# Patient Record
Sex: Female | Born: 1960 | Race: White | Hispanic: No | Marital: Married | Smoking: Never smoker
Health system: Southern US, Community
[De-identification: ages and names within clinical notes are randomized; demographics above are authoritative.]

## PROBLEM LIST (undated history)

## (undated) DIAGNOSIS — M069 Rheumatoid arthritis, unspecified: Secondary | ICD-10-CM

## (undated) HISTORY — DX: Rheumatoid arthritis, unspecified: M06.9

---

## 1991-06-14 HISTORY — PX: NASAL SINUS SURGERY: SHX719

## 1997-07-18 ENCOUNTER — Inpatient Hospital Stay (HOSPITAL_COMMUNITY): Admission: AD | Admit: 1997-07-18 | Discharge: 1997-07-21 | Payer: Self-pay | Admitting: Obstetrics and Gynecology

## 1997-07-23 ENCOUNTER — Encounter: Admission: RE | Admit: 1997-07-23 | Discharge: 1997-10-21 | Payer: Self-pay | Admitting: Obstetrics and Gynecology

## 1998-06-13 HISTORY — PX: DILATION AND CURETTAGE OF UTERUS: SHX78

## 1998-09-28 ENCOUNTER — Other Ambulatory Visit: Admission: RE | Admit: 1998-09-28 | Discharge: 1998-09-28 | Payer: Self-pay | Admitting: Obstetrics and Gynecology

## 1999-08-04 ENCOUNTER — Encounter: Admission: RE | Admit: 1999-08-04 | Discharge: 1999-08-04 | Payer: Self-pay | Admitting: Family Medicine

## 1999-08-04 ENCOUNTER — Encounter: Payer: Self-pay | Admitting: Family Medicine

## 1999-09-28 ENCOUNTER — Ambulatory Visit (HOSPITAL_COMMUNITY): Admission: RE | Admit: 1999-09-28 | Discharge: 1999-09-28 | Payer: Self-pay | Admitting: Obstetrics and Gynecology

## 1999-09-28 ENCOUNTER — Encounter: Payer: Self-pay | Admitting: Obstetrics and Gynecology

## 1999-10-07 ENCOUNTER — Ambulatory Visit (HOSPITAL_COMMUNITY): Admission: RE | Admit: 1999-10-07 | Discharge: 1999-10-07 | Payer: Self-pay | Admitting: Obstetrics and Gynecology

## 1999-10-07 ENCOUNTER — Encounter (INDEPENDENT_AMBULATORY_CARE_PROVIDER_SITE_OTHER): Payer: Self-pay | Admitting: Specialist

## 1999-10-22 ENCOUNTER — Other Ambulatory Visit: Admission: RE | Admit: 1999-10-22 | Discharge: 1999-10-22 | Payer: Self-pay | Admitting: Obstetrics and Gynecology

## 2000-08-16 ENCOUNTER — Ambulatory Visit (HOSPITAL_COMMUNITY): Admission: RE | Admit: 2000-08-16 | Discharge: 2000-08-16 | Payer: Self-pay | Admitting: Obstetrics and Gynecology

## 2000-08-16 ENCOUNTER — Encounter: Payer: Self-pay | Admitting: Obstetrics and Gynecology

## 2001-01-12 ENCOUNTER — Inpatient Hospital Stay (HOSPITAL_COMMUNITY): Admission: AD | Admit: 2001-01-12 | Discharge: 2001-01-14 | Payer: Self-pay | Admitting: Obstetrics and Gynecology

## 2001-02-23 ENCOUNTER — Other Ambulatory Visit: Admission: RE | Admit: 2001-02-23 | Discharge: 2001-02-23 | Payer: Self-pay | Admitting: Obstetrics and Gynecology

## 2002-02-25 ENCOUNTER — Other Ambulatory Visit: Admission: RE | Admit: 2002-02-25 | Discharge: 2002-02-25 | Payer: Self-pay | Admitting: Obstetrics and Gynecology

## 2003-02-26 ENCOUNTER — Other Ambulatory Visit: Admission: RE | Admit: 2003-02-26 | Discharge: 2003-02-26 | Payer: Self-pay | Admitting: Obstetrics and Gynecology

## 2004-03-12 ENCOUNTER — Encounter: Admission: RE | Admit: 2004-03-12 | Discharge: 2004-03-12 | Payer: Self-pay | Admitting: Obstetrics and Gynecology

## 2004-07-16 ENCOUNTER — Encounter: Admission: RE | Admit: 2004-07-16 | Discharge: 2004-07-16 | Payer: Self-pay | Admitting: Obstetrics and Gynecology

## 2005-03-07 ENCOUNTER — Other Ambulatory Visit: Admission: RE | Admit: 2005-03-07 | Discharge: 2005-03-07 | Payer: Self-pay | Admitting: Obstetrics and Gynecology

## 2005-06-03 ENCOUNTER — Ambulatory Visit: Payer: Self-pay | Admitting: Internal Medicine

## 2005-06-23 ENCOUNTER — Ambulatory Visit: Payer: Self-pay | Admitting: Internal Medicine

## 2005-07-05 ENCOUNTER — Ambulatory Visit: Payer: Self-pay | Admitting: Oncology

## 2005-07-22 ENCOUNTER — Ambulatory Visit: Payer: Self-pay | Admitting: Internal Medicine

## 2005-07-28 ENCOUNTER — Encounter: Admission: RE | Admit: 2005-07-28 | Discharge: 2005-07-28 | Payer: Self-pay | Admitting: Obstetrics and Gynecology

## 2006-03-08 ENCOUNTER — Other Ambulatory Visit: Admission: RE | Admit: 2006-03-08 | Discharge: 2006-03-08 | Payer: Self-pay | Admitting: Obstetrics and Gynecology

## 2006-08-02 ENCOUNTER — Encounter: Admission: RE | Admit: 2006-08-02 | Discharge: 2006-08-02 | Payer: Self-pay | Admitting: Obstetrics and Gynecology

## 2007-08-06 ENCOUNTER — Encounter: Admission: RE | Admit: 2007-08-06 | Discharge: 2007-08-06 | Payer: Self-pay | Admitting: Obstetrics and Gynecology

## 2008-08-07 ENCOUNTER — Encounter: Admission: RE | Admit: 2008-08-07 | Discharge: 2008-08-07 | Payer: Self-pay | Admitting: Obstetrics and Gynecology

## 2009-08-14 ENCOUNTER — Encounter: Admission: RE | Admit: 2009-08-14 | Discharge: 2009-08-14 | Payer: Self-pay | Admitting: Obstetrics and Gynecology

## 2010-09-01 ENCOUNTER — Other Ambulatory Visit: Payer: Self-pay | Admitting: Obstetrics and Gynecology

## 2010-09-01 DIAGNOSIS — Z1231 Encounter for screening mammogram for malignant neoplasm of breast: Secondary | ICD-10-CM

## 2010-09-21 ENCOUNTER — Ambulatory Visit: Payer: Self-pay

## 2010-09-24 ENCOUNTER — Ambulatory Visit
Admission: RE | Admit: 2010-09-24 | Discharge: 2010-09-24 | Disposition: A | Discharge status: Home / Self Care | Payer: BC Managed Care – PPO | Source: Ambulatory Visit | Attending: Obstetrics and Gynecology | Admitting: Obstetrics and Gynecology

## 2010-09-24 DIAGNOSIS — Z1231 Encounter for screening mammogram for malignant neoplasm of breast: Secondary | ICD-10-CM

## 2010-10-29 NOTE — Discharge Summary (Signed)
[facility]  Patient:    Carmen Christensen, Carmen Christensen                      MRN: [id number] Adm. Date:  [id number] Disc. Date: [id number] Attending:  Malon Kindle                           Discharge Summary  ADMISSION DIAGNOSES:          1. Intrauterine pregnancy at 39 weeks.                               2. Group B streptococcus carrier.                               3. Advanced maternal age.  DISCHARGE DIAGNOSES:          1. Intrauterine pregnancy at 39 weeks.                               2. Group B streptococcus carrier.                               3. Advanced maternal age.  PROCEDURES:                   Spontaneous vaginal delivery.  COMPLICATIONS:                None.  CONSULTATIONS:                None.  HISTORY AND PHYSICAL:         This is a 50 year old white female, gravida 3, para 1-0-1-1 with an EGA of [redacted] weeks by an LMP consistent with a 9-week ultrasound with a due date of August 6, who presented with a complaint of ruptured membranes at approximately 2330 on August 1 followed by irregular contractions. Prenatal care was complicated by anemia treated with iron.  PRENATAL LABORATORY DATA:     Blood type A positive with a negative antibody screen. RPR nonreactive, rubella immune, hepatitis B surface antigen negative, HIV negative, gonorrhea and Chlamydia negative, triple screen was declined. Amniocentesis was declined. One-hour glucola was 88. Group B strep was positive with her last pregnancy.  PAST OBSTETRICAL HISTORY:     February 1999 vaginal delivery at term, 6 pounds 15 ounces, at 39 weeks, and she had a protracted course. In April 2001 she had a missed abortion treated with a D&C.  GYNECOLOGICAL HISTORY:        History of human papillomavirus and she had cryotherapy in 1990 with normal followup Pap smears.  PAST MEDICAL HISTORY:         History of depression, asthma, and eczema.  PAST SURGICAL HISTORY:        In 1994 she had  sinus surgery and in 2001 the D&C and cryotherapy.  ALLERGIES:                    None known.  CURRENT MEDICATIONS:          Iron and prenatal vitamins.  PHYSICAL EXAMINATION:         She was afebrile with stable vital signs. Abdomen soft with a fundal height  of 38 cm. Fetal heart tracing had decelerations following placement of her epidural catheter. Vaginal exam per the nurses on admission, she was 1/90/-1 with evidence of ruptured membranes.  HOSPITAL COURSE:              The patient was admitted with ruptured membranes and with irregular contractions. She was started on penicillin for group B strep and eventually received an epidural. Dr. Ambrose Mantle examined her on the morning of August 2 and she was 4 cm dilated, 80% effaced, with molding. She continued to progress on her own, and on my first exam later that morning, she was 9, complete, and +1. She progressed to complete and pushed well with a slightly prolonged second stage. She had an SVD of a viable female infant with Apgars of 7 and 9 that weighed 7 pounds 5 ounces over a second-degree laceration. The placenta delivered spontaneous and was intact. Her laceration was repaired with 3-0 Vicryl and she had marked labial edema. Estimated blood loss was less than 500 cc.  Postpartum she did very well, remained afebrile, and breast fed her baby without complications. On the morning of postpartum day #2 she was stable for discharge home.  CONDITION ON DISCHARGE:       Stable.  DISPOSITION:                  Discharge to home.  DISCHARGE INSTRUCTIONS:       Diet: Regular. Activity: Pelvic rest.  DISCHARGE FOLLOWUP:           The patient is to follow up in four to six weeks and she is given our discharge pamphlet. DD:  01/14/01 TD:  01/15/01 Job: [id number]

## 2011-09-12 ENCOUNTER — Other Ambulatory Visit: Payer: Self-pay | Admitting: Obstetrics and Gynecology

## 2011-09-12 DIAGNOSIS — Z1231 Encounter for screening mammogram for malignant neoplasm of breast: Secondary | ICD-10-CM

## 2011-09-29 ENCOUNTER — Ambulatory Visit
Admission: RE | Admit: 2011-09-29 | Discharge: 2011-09-29 | Disposition: A | Discharge status: Home / Self Care | Payer: BC Managed Care – PPO | Source: Ambulatory Visit | Attending: Obstetrics and Gynecology | Admitting: Obstetrics and Gynecology

## 2011-09-29 DIAGNOSIS — Z1231 Encounter for screening mammogram for malignant neoplasm of breast: Secondary | ICD-10-CM

## 2012-08-13 ENCOUNTER — Other Ambulatory Visit: Payer: Self-pay

## 2012-10-01 ENCOUNTER — Ambulatory Visit
Admission: RE | Admit: 2012-10-01 | Discharge: 2012-10-01 | Disposition: A | Discharge status: Home / Self Care | Payer: BC Managed Care – PPO | Source: Ambulatory Visit

## 2012-10-01 DIAGNOSIS — Z1231 Encounter for screening mammogram for malignant neoplasm of breast: Secondary | ICD-10-CM

## 2012-12-10 ENCOUNTER — Telehealth: Payer: Self-pay | Admitting: Internal Medicine

## 2012-12-10 NOTE — Telephone Encounter (Signed)
Patient had a colonoscopy in 2007 she reports no polyps.  I have ordered her paper chart.  She has family history of colon polyps in her mother and father and colon cancer in a paternal grandfather.  She is wondering if she is due for a colonoscopy at age 52?  I will put the chart in your office when it is available.

## 2012-12-12 NOTE — Telephone Encounter (Signed)
She could have a colonoscopy any time before January 2017, would be reasonable given her history. Not as urgent, given the fact that she had a negative exam in 2007.

## 2012-12-13 NOTE — Telephone Encounter (Signed)
Spoke with pt and she is aware. Recall entered. 

## 2014-01-31 IMAGING — MG MM DIGITAL SCREENING BILAT
8 series · 9 of 24 positions shown · non-contrast
Comparison: Previous exams.

CLINICAL DATA: Screening.

DIGITAL BILATERAL SCREENING MAMMOGRAM WITH CAD
DIGITAL BREAST TOMOSYNTHESIS
Digital breast tomosynthesis images are acquired in two
projections.  These images are reviewed in combination with the
digital mammogram, confirming the findings below.

[R CC]
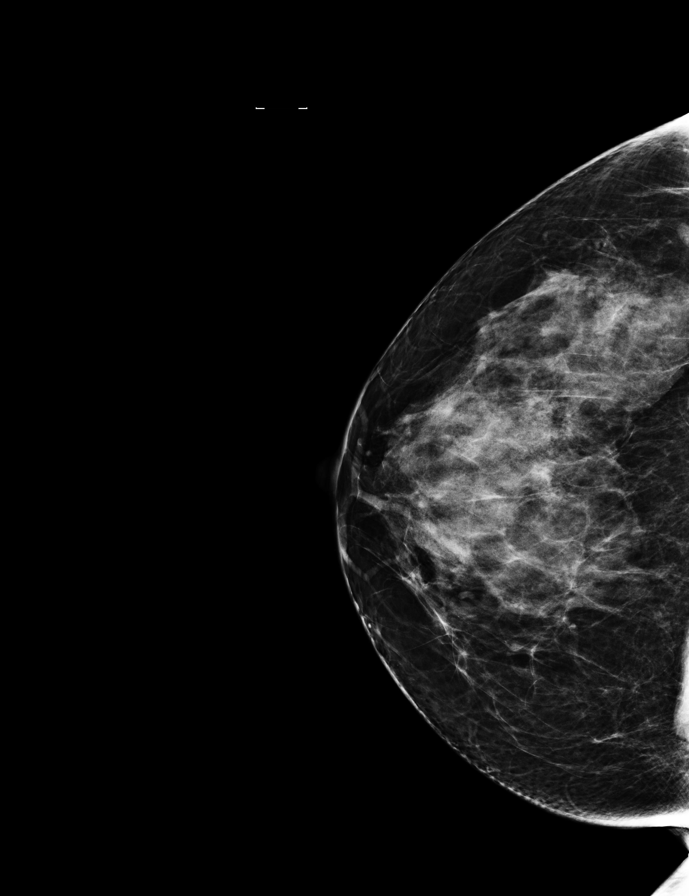

[R MLO]
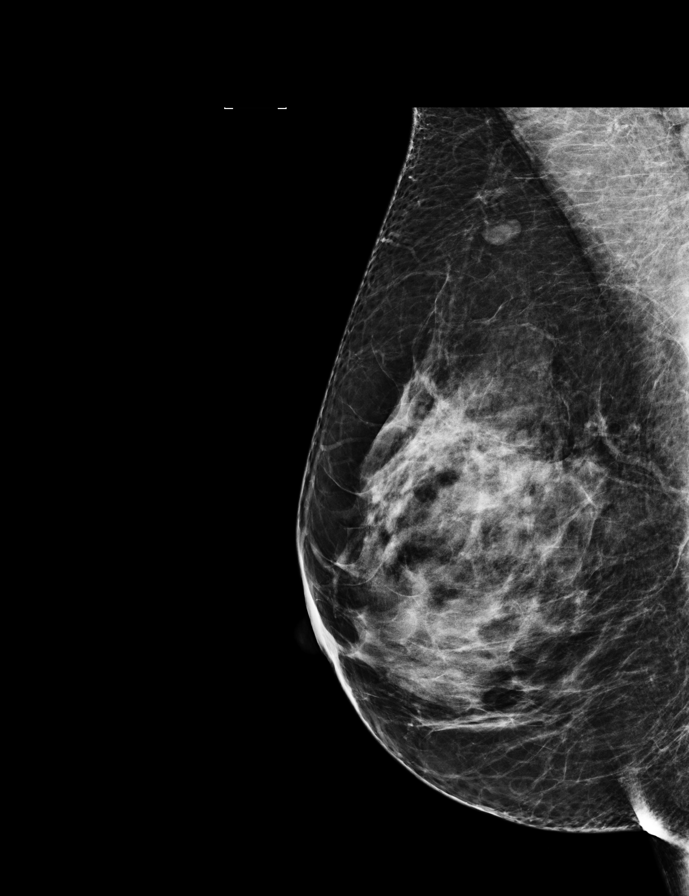

[L CC]
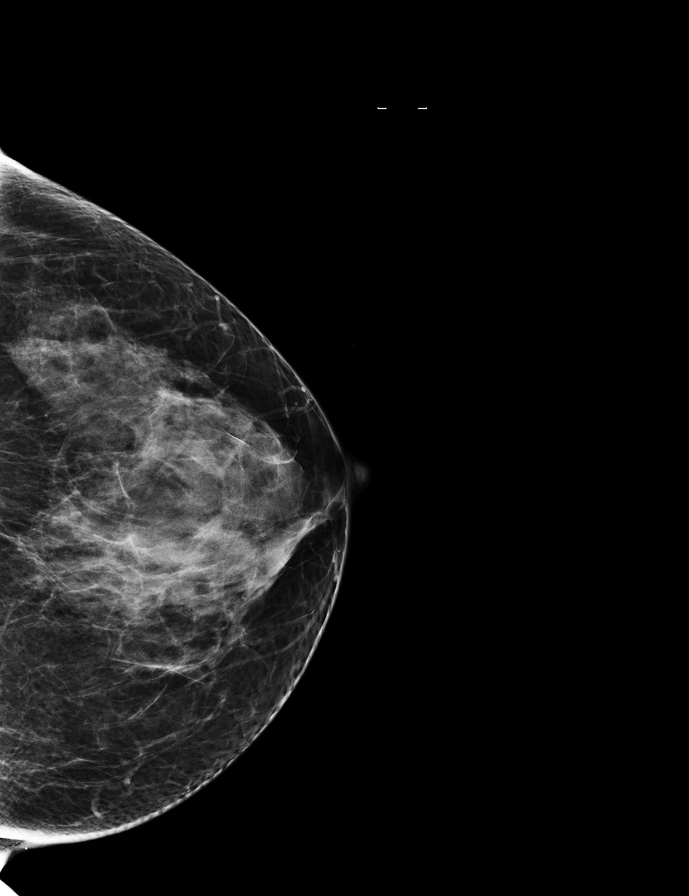

[L MLO]
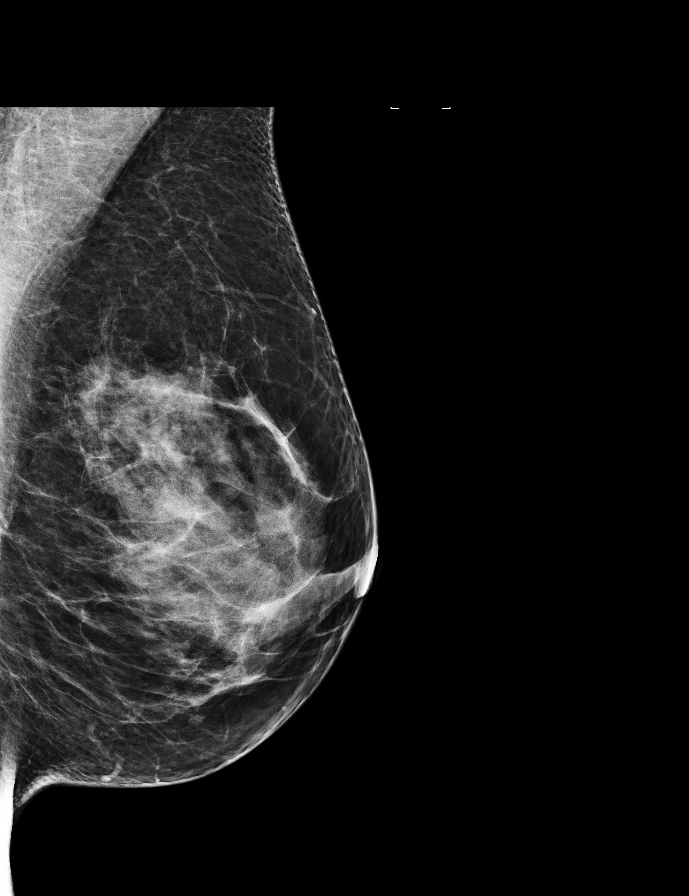

[L CC tomo · 2 of 72 frames shown]
[frame 24/72]
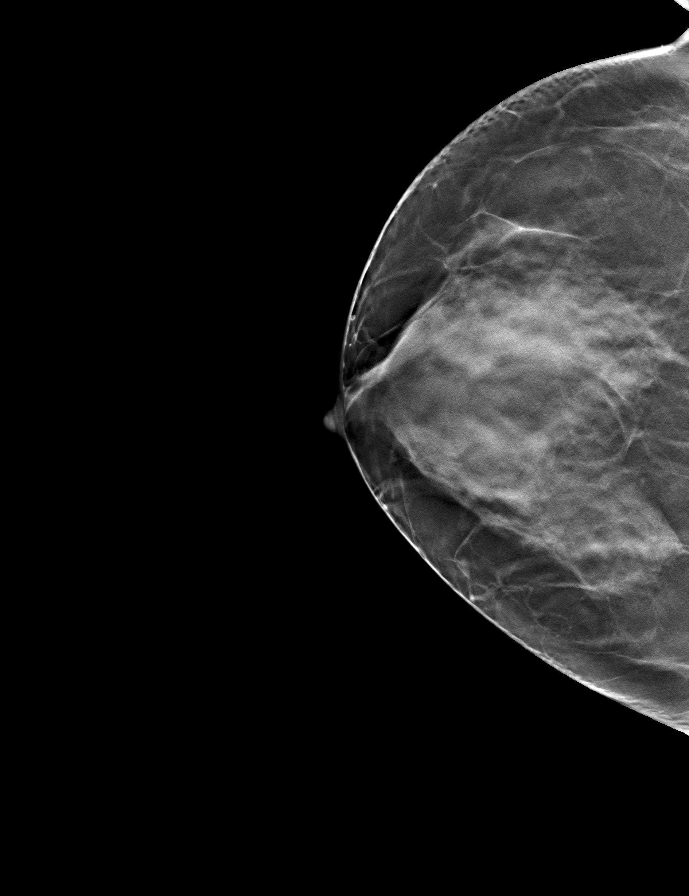
[frame 37/72]
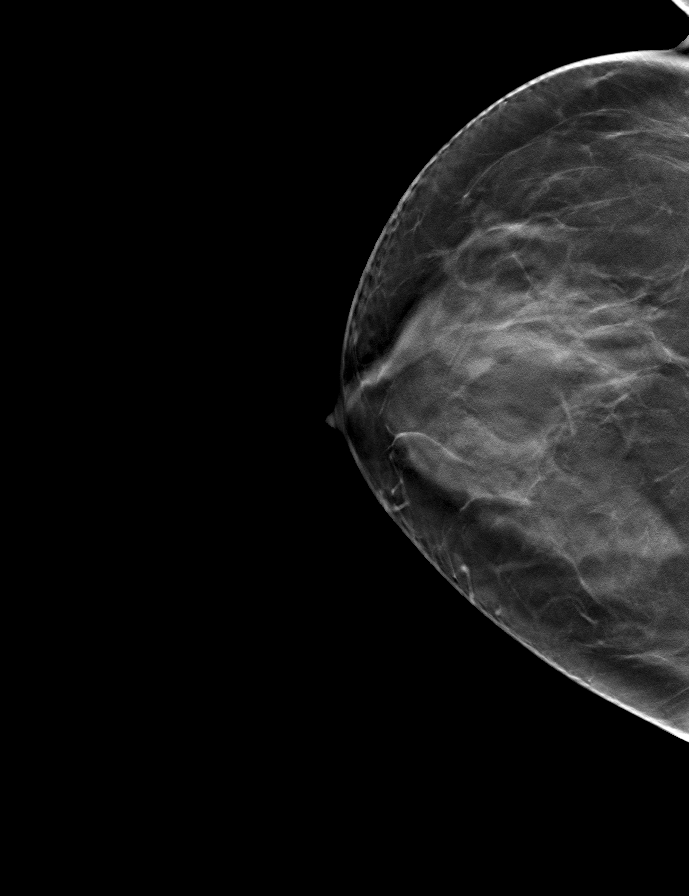

[R MLO tomo · tomo slice 37/72.0]
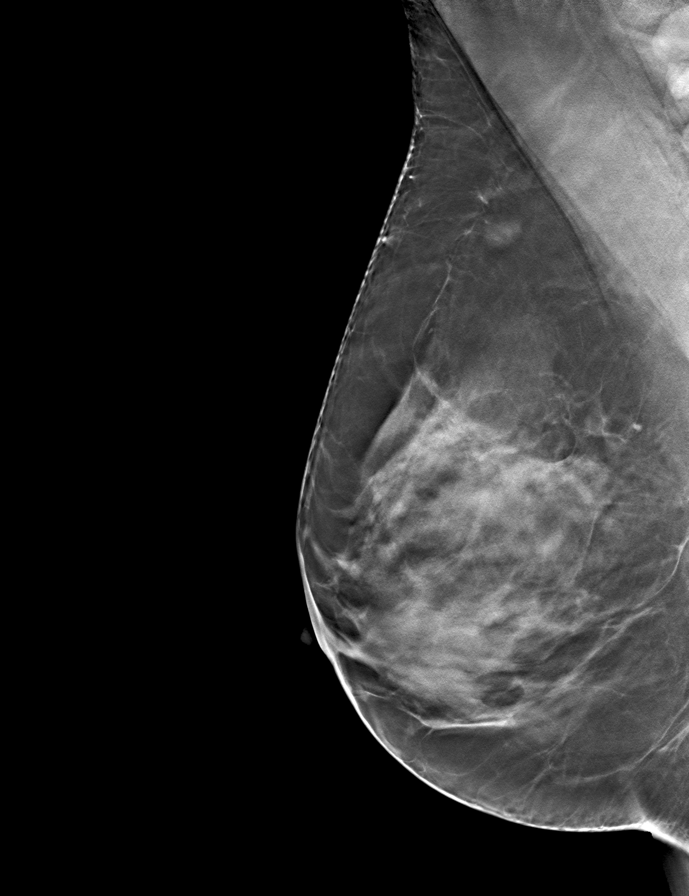

[R CC tomo · tomo slice 37/73.0]
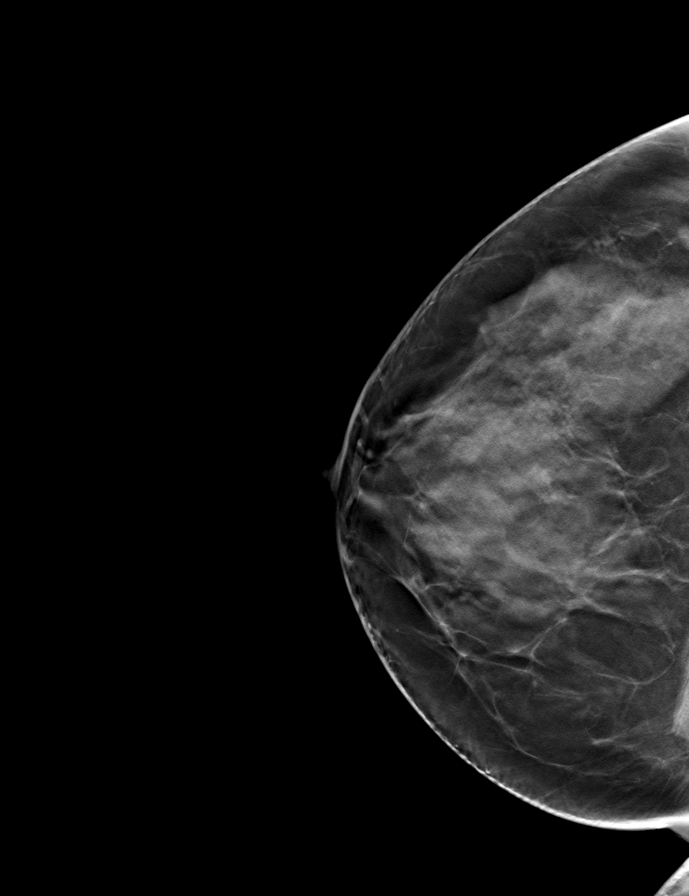

[L MLO tomo · tomo slice 35/68.0]
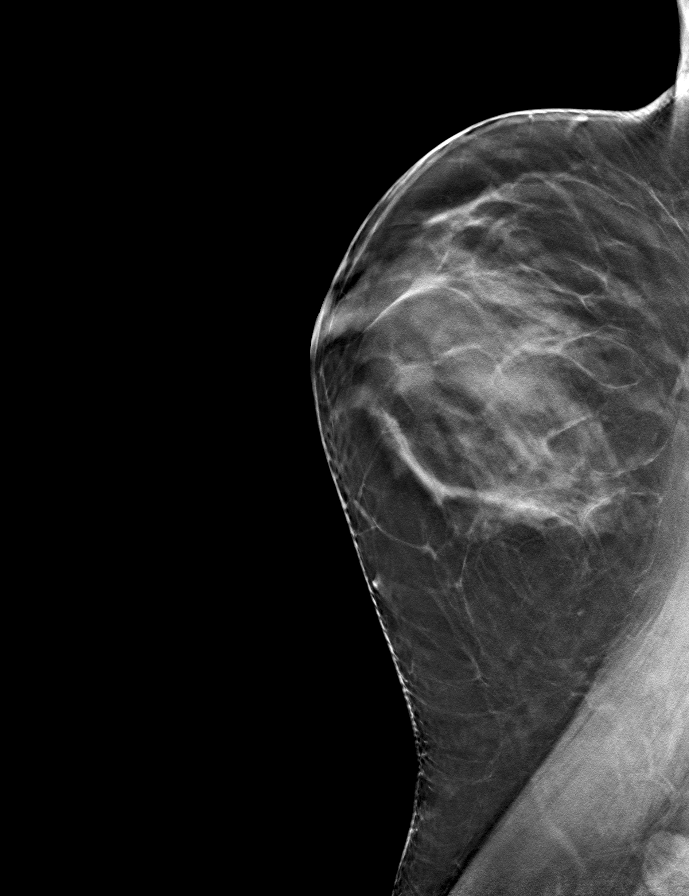

[9 of 24 positions shown; findings below may reference images not displayed]

FINDINGS: ACR Breast Density Category 4: The breast tissue is extremely
dense.

No suspicious masses, architectural distortion, or calcifications
are present.

Images were processed with CAD.
IMPRESSION: No mammographic evidence of malignancy.

A result letter of this screening mammogram will be mailed directly
to the patient.

RECOMMENDATION:
Screening mammogram in one year. (Code:J6-M-LX9)

BI-RADS CATEGORY 1:  Negative.

## 2015-05-12 ENCOUNTER — Encounter: Payer: Self-pay | Admitting: Internal Medicine

## 2015-07-16 ENCOUNTER — Ambulatory Visit (AMBULATORY_SURGERY_CENTER): Payer: Self-pay | Admitting: *Deleted

## 2015-07-16 VITALS — Ht 61.0 in | Wt 129.6 lb

## 2015-07-16 DIAGNOSIS — Z1211 Encounter for screening for malignant neoplasm of colon: Secondary | ICD-10-CM

## 2015-07-16 MED ORDER — NA SULFATE-K SULFATE-MG SULF 17.5-3.13-1.6 GM/177ML PO SOLN: ORAL | Status: DC

## 2015-07-16 NOTE — Progress Notes (Signed)
No allergies to eggs or soy. No problems with anesthesia.  Pt given Emmi instructions for colonoscopy  No oxygen use  No diet drug use  

## 2015-07-30 ENCOUNTER — Ambulatory Visit (AMBULATORY_SURGERY_CENTER): Payer: BC Managed Care – PPO | Admitting: Internal Medicine

## 2015-07-30 ENCOUNTER — Encounter: Payer: Self-pay | Admitting: Internal Medicine

## 2015-07-30 VITALS — BP 105/57 | HR 85 | Temp 98.0°F | Resp 20 | Ht 61.0 in | Wt 129.0 lb

## 2015-07-30 DIAGNOSIS — Z1211 Encounter for screening for malignant neoplasm of colon: Secondary | ICD-10-CM | POA: Diagnosis present

## 2015-07-30 MED ORDER — SODIUM CHLORIDE 0.9 % IV SOLN: 500.0000 mL | INTRAVENOUS | Status: DC

## 2015-07-30 NOTE — Patient Instructions (Addendum)
YOU HAD AN ENDOSCOPIC PROCEDURE TODAY AT THE [facility]:   Refer to the procedure report that was given to you for any specific questions about what was found during the examination.  If the procedure report does not answer your questions, please call your gastroenterologist to clarify.  If you requested that your care partner not be given the details of your procedure findings, then the procedure report has been included in a sealed envelope for you to review at your convenience later.  YOU SHOULD EXPECT: Some feelings of bloating in the abdomen. Passage of more gas than usual.  Walking can help get rid of the air that was put into your GI tract during the procedure and reduce the bloating. If you had a lower endoscopy (such as a colonoscopy or flexible sigmoidoscopy) you may notice spotting of blood in your stool or on the toilet paper. If you underwent a bowel prep for your procedure, you may not have a normal bowel movement for a few days.  Please Note:  You might notice some irritation and congestion in your nose or some drainage.  This is from the oxygen used during your procedure.  There is no need for concern and it should clear up in a day or so.  SYMPTOMS TO REPORT IMMEDIATELY:   Following lower endoscopy (colonoscopy or flexible sigmoidoscopy):  Excessive amounts of blood in the stool  Significant tenderness or worsening of abdominal pains  Swelling of the abdomen that is new, acute  Fever of 100F or higher  For urgent or emergent issues, a gastroenterologist can be reached at any hour by calling [phone].   DIET: Your first meal following the procedure should be a small meal and then it is ok to progress to your normal diet. Heavy or fried foods are harder to digest and may make you feel nauseous or bloated.  Likewise, meals heavy in dairy and vegetables can increase bloating.  Drink plenty of fluids but you should avoid alcoholic beverages for 24  hours.  ACTIVITY:  You should plan to take it easy for the rest of today and you should NOT DRIVE or use heavy machinery until tomorrow (because of the sedation medicines used during the test).    FOLLOW UP: Our staff will call the number listed on your records the next business day following your procedure to check on you and address any questions or concerns that you may have regarding the information given to you following your procedure. If we do not reach you, we will leave a message.  However, if you are feeling well and you are not experiencing any problems, there is no need to return our call.  We will assume that you have returned to your regular daily activities without incident.  If any biopsies were taken you will be contacted by phone or by letter within the next 1-3 weeks.  Please call us at [phone] if you have not heard about the biopsies in 3 weeks.    SIGNATURES/CONFIDENTIALITY: You and/or your care partner have signed paperwork which will be entered into your electronic medical record.  These signatures attest to the fact that that the information above on your After Visit Summary has been reviewed and is understood.  Full responsibility of the confidentiality of this discharge information lies with you and/or your care-partner.  Next colonoscopy in 10 years. 

## 2015-07-30 NOTE — Op Note (Signed)
[facility] [address] [place], [address]   COLONOSCOPY PROCEDURE REPORT  PATIENT: Carmen Christensen, Carmen Christensen  MR#: [id number] BIRTHDATE: 02/19/1961 , 54  yrs. old GENDER: female ENDOSCOPIST: Roxy Cedar, MD REFERRED XB:[id number] Recall, PROCEDURE DATE:  07/30/2015 PROCEDURE:   Colonoscopy, screening First Screening Colonoscopy - Avg.  risk and is 50 yrs.  old or older - No.  Prior Negative Screening - Now for repeat screening. N/A  History of Adenoma - Now for follow-up colonoscopy & has been > or = to 3 yrs.  N/A  Polyps removed today? No Recommend repeat exam, <10 yrs? No ASA CLASS:   Class I INDICATIONS:Screening for colonic neoplasia and Colorectal Neoplasm Risk Assessment for this procedure is average risk.   . Her examination 2007 to evaluate heme positive stool. This was normal MEDICATIONS: Monitored anesthesia care and Propofol 300 mg IV  DESCRIPTION OF PROCEDURE:   After the risks benefits and alternatives of the procedure were thoroughly explained, informed consent was obtained.  The digital rectal exam revealed no abnormalities of the rectum.   The LB [id number]  endoscope was introduced through the anus and advanced to the cecum, which was identified by both the appendix and ileocecal valve. No adverse events experienced.   The quality of the prep was excellent. (Suprep was used)  The instrument was then slowly withdrawn as the colon was fully examined. Estimated blood loss is zero unless otherwise noted in this procedure report.    COLON FINDINGS: A normal appearing cecum, ileocecal valve, and appendiceal orifice were identified.  The ascending, transverse, descending, sigmoid colon, and rectum appeared unremarkable. Retroflexed views revealed internal hemorrhoids. The time to cecum = 1.8 Withdrawal time = 6.6   The scope was withdrawn and the procedure completed. COMPLICATIONS: There were no immediate complications.  ENDOSCOPIC  IMPRESSION: 1. Normal colonoscopy  RECOMMENDATIONS: 1. Continue current colorectal screening recommendations for "routine risk" patients with a repeat colonoscopy in 10 years.  eSigned:  Roxy Cedar, MD 07/30/2015 1:03 PM   cc: The Patient, Tracey Harries, MD, and Huel Cote, MD

## 2015-07-30 NOTE — Progress Notes (Signed)
Report to PACU, RN, vss, BBS= Clear.  

## 2015-07-31 ENCOUNTER — Telehealth: Payer: Self-pay

## 2015-07-31 NOTE — Telephone Encounter (Signed)
  Follow up Call-  Call back number 07/30/2015  Post procedure Call Back phone  # [phone]  Permission to leave phone message Yes     Patient questions:  Do you have a fever, pain , or abdominal swelling? No. Pain Score  0 *  Have you tolerated food without any problems? Yes.    Have you been able to return to your normal activities? Yes.    Do you have any questions about your discharge instructions: Diet   No. Medications  No. Follow up visit  No.  Do you have questions or concerns about your Care? No.  Actions: * If pain score is 4 or above: No action needed, pain <4.

## 2019-05-02 ENCOUNTER — Ambulatory Visit: Payer: Self-pay

## 2019-05-02 ENCOUNTER — Encounter: Payer: Self-pay | Admitting: Rheumatology

## 2019-05-02 ENCOUNTER — Other Ambulatory Visit: Payer: Self-pay

## 2019-05-02 ENCOUNTER — Ambulatory Visit: Payer: BC Managed Care – PPO | Admitting: Rheumatology

## 2019-05-02 VITALS — BP 121/65 | HR 99 | Resp 14 | Ht 61.0 in | Wt 126.6 lb

## 2019-05-02 DIAGNOSIS — M79642 Pain in left hand: Secondary | ICD-10-CM | POA: Diagnosis not present

## 2019-05-02 DIAGNOSIS — M79641 Pain in right hand: Secondary | ICD-10-CM

## 2019-05-02 DIAGNOSIS — M79672 Pain in left foot: Secondary | ICD-10-CM | POA: Diagnosis not present

## 2019-05-02 DIAGNOSIS — G8929 Other chronic pain: Secondary | ICD-10-CM

## 2019-05-02 DIAGNOSIS — M0579 Rheumatoid arthritis with rheumatoid factor of multiple sites without organ or systems involvement: Secondary | ICD-10-CM

## 2019-05-02 DIAGNOSIS — M79671 Pain in right foot: Secondary | ICD-10-CM | POA: Diagnosis not present

## 2019-05-02 DIAGNOSIS — M25511 Pain in right shoulder: Secondary | ICD-10-CM

## 2019-05-02 DIAGNOSIS — F5101 Primary insomnia: Secondary | ICD-10-CM

## 2019-05-02 DIAGNOSIS — Z79899 Other long term (current) drug therapy: Secondary | ICD-10-CM

## 2019-05-02 DIAGNOSIS — M25512 Pain in left shoulder: Secondary | ICD-10-CM

## 2019-05-02 MED ORDER — PREDNISONE 5 MG PO TABS: ORAL_TABLET | ORAL | 0 refills | Status: AC

## 2019-05-02 NOTE — Progress Notes (Signed)
Pharmacy Note  Subjective: Patient presents today to [facility] for follow up office visit.   Patient seen by the pharmacist for counseling on hydroxychloroquine rheumatoid arthritis. She is naive to therapy.  Objective: CMP: pending 05/02/2019  CBC: pending 05/02/2019  Assessment/Plan: Patient was counseled on the purpose, proper use, and adverse effects of hydroxychloroquine including nausea/diarrhea, skin rash, headaches, and sun sensitivity.  Discussed importance of annual eye exams while on hydroxychloroquine to monitor to ocular toxicity and discussed importance of frequent laboratory monitoring.  Provided patient with eye exam form for baseline ophthalmologic exam and standing lab instructions.  Provided patient with educational materials on hydroxychloroquine and answered all questions.  Patient consented to hydroxychloroquine.  Will upload consent in the media tab.    Dose will be Plaquenil 200 mg twice daily Monday through Friday based on weight of 57.4 kg and 5'1". Prescription pending lab results.  She is also to start a prednisone taper 20 mg taper by 5 every 4 days.  All questions encouraged and answered.  Instructed patient to call with any other questions or concerns.  Mariella Saa, PharmD, [place], [place] [facility] Pharmacist [phone]  05/02/2019 1:49 PM

## 2019-05-02 NOTE — Progress Notes (Signed)
Office Visit Note  Patient: Carmen Christensen             Date of Birth: 06-06-1961           MRN: [id number]             PCP: Bernerd Limbo, MD Referring: Bernerd Limbo, MD Visit Date: 05/02/2019 Occupation: @GUAROCC @  Subjective:  Pain in multiple joints and positive rheumatoid factor   History of Present Illness: Carmen Christensen is a 58 y.o. female seen in consultation per request of her PCP.  According to patient her symptoms a started on Mother's Day after she got some shrubs the next day her right shoulder joint was extremely painful.  She states she had severe pain and nocturnal pain which lasted for about 4 days and then gradually improved.  Since then she has had episodic discomfort in multiple joints including her shoulder joints, wrist joints, bilateral hands, one episode of left foot involvement.  The episodes have been happening almost every week.  She states one time she was seen by an orthopedic doctor who did exam and exam was normal.  She was given meloxicam 15 mg for 2 weeks but she had an episode while she was on meloxicam.  She states she had pain in her shoulders last week.  And now her left wrist and left hand has been swollen since last night.  She states she is in constant discomfort and is unable to do routine activities.  There are days she cannot even wash her hair.  Activities of Daily Living:  Patient reports morning stiffness for 3 minutes.   Patient Reports nocturnal pain.  Difficulty dressing/grooming: Reports Difficulty climbing stairs: Denies Difficulty getting out of chair: Denies Difficulty using hands for taps, buttons, cutlery, and/or writing: Reports  Review of Systems  Constitutional: Positive for fatigue. Negative for night sweats, weight gain and weight loss.  HENT: Negative for mouth sores, trouble swallowing, trouble swallowing, mouth dryness and nose dryness.   Eyes: Positive for dryness. Negative for pain, redness and visual disturbance.   Respiratory: Negative for cough, shortness of breath and difficulty breathing.   Cardiovascular: Negative for chest pain, palpitations, hypertension, irregular heartbeat and swelling in legs/feet.  Gastrointestinal: Negative for blood in stool, constipation and diarrhea.  Endocrine: Negative for excessive thirst and increased urination.  Genitourinary: Negative for difficulty urinating and vaginal dryness.  Musculoskeletal: Positive for arthralgias, joint pain, joint swelling and morning stiffness. Negative for myalgias, muscle weakness, muscle tenderness and myalgias.  Skin: Negative for color change, rash, hair loss, skin tightness, ulcers and sensitivity to sunlight.  Allergic/Immunologic: Negative for susceptible to infections.  Neurological: Negative for dizziness, numbness, memory loss, night sweats and weakness.  Hematological: Negative for bruising/bleeding tendency and swollen glands.  Psychiatric/Behavioral: Positive for sleep disturbance. Negative for depressed mood. The patient is not nervous/anxious.     PMFS History:  There are no active problems to display for this patient.   History reviewed. No pertinent past medical history.  Family History  Problem Relation Age of Onset  . Colon cancer Paternal Grandfather        not sure age of onset   Past Surgical History:  Procedure Laterality Date  . DILATION AND CURETTAGE OF UTERUS  2000  . NASAL SINUS SURGERY  1993   Social History   Social History Narrative  . Not on file    There is no immunization history on file for this patient.   Objective: Vital Signs: BP  121/65 (BP Location: Right Arm, Patient Position: Sitting, Cuff Size: Normal)   Pulse 99   Resp 14   Ht 5\' 1"  (1.549 m)   Wt 126 lb 9.6 oz (57.4 kg)   BMI 23.92 kg/m    Physical Exam Vitals signs and nursing note reviewed.  Constitutional:      Appearance: She is well-developed.  HENT:     Head: Normocephalic and atraumatic.  Eyes:      Conjunctiva/sclera: Conjunctivae normal.  Neck:     Musculoskeletal: Normal range of motion.  Cardiovascular:     Rate and Rhythm: Normal rate and regular rhythm.     Heart sounds: Normal heart sounds.  Pulmonary:     Effort: Pulmonary effort is normal.     Breath sounds: Normal breath sounds.  Abdominal:     General: Bowel sounds are normal.     Palpations: Abdomen is soft.  Lymphadenopathy:     Cervical: No cervical adenopathy.  Skin:    General: Skin is warm and dry.     Capillary Refill: Capillary refill takes less than 2 seconds.  Neurological:     Mental Status: She is alert and oriented to person, place, and time.  Psychiatric:        Behavior: Behavior normal.      Musculoskeletal Exam: C-spine was in good range of motion.  Shoulder joints elbow joints wrist joints with good range of motion.  She had tenderness and discomfort range of motion of her shoulder joints.  She has tenosynovitis on the left wrist joint.  She has synovitis over left third MCP.  Hip joints and knee joints with good range of motion.  Ankle joints MTPs PIPs with good range of motion with no synovitis.  CDAI Exam: CDAI Score: 7.4  Patient Global: 7 mm; Provider Global: 7 mm Swollen: 2 ; Tender: 4  Joint Exam      Right  Left  Glenohumeral   Tender   Tender  Wrist     Swollen Tender  MCP 3     Swollen Tender     Investigation: No additional findings.  Imaging: Xr Foot 2 Views Left  Result Date: 05/02/2019 Inferior calcaneal spur was noted.  No MTP PIP or DIP narrowing was noted.  No intertarsal tibiotalar joint space narrowing was noted. Impression: Unremarkable x-ray of the foot.  An inferior calcaneal spur was noted.  Xr Foot 2 Views Right  Result Date: 05/02/2019 Inferior calcaneal spur was noted.  No MTP PIP or DIP narrowing was noted.  No intertarsal tibiotalar joint space narrowing was noted. Impression: Unremarkable x-ray of the foot.  An inferior calcaneal spur was noted.  Xr  Hand 2 View Left  Result Date: 05/02/2019 No CMC, PIP and DIP narrowing was noted.  No MCP, intercarpal radiocarpal joint space narrowing was noted. ImpressioNon: Unremarkable x-ray of the hand.  Xr Hand 2 View Right  Result Date: 05/02/2019 No CMC, PIP and DIP narrowing was noted.  No MCP, intercarpal radiocarpal joint space narrowing was noted. ImpressioNon: Unremarkable x-ray of the hand.   Recent Labs: No results found for: WBC, HGB, PLT, NA, K, CL, CO2, GLUCOSE, BUN, CREATININE, BILITOT, ALKPHOS, AST, ALT, PROT, ALBUMIN, CALCIUM, GFRAA, QFTBGOLD, QFTBGOLDPLUS  Speciality Comments: No specialty comments available.  Procedures:  No procedures performed Allergies: Other   Assessment / Plan:     Visit Diagnoses: Rheumatoid arthritis involving multiple sites with positive rheumatoid factor (HCC) - 04/22/19: ANA-, sed rate 75, RF 62.5.  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  She has been having episodes almost every week with pain and swelling in the joint.  Today she has tenosynovitis and synovitis in her left hand and wrist.  She also had discomfort range of motion of bilateral shoulder joints.  Detailed counseling guarding rheumatoid arthritis was provided.  Different treatment options and their side effects were discussed.  As she is a lot of discomfort and is experiencing swelling I will put her on a prednisone taper.  The plan was to start her on 20 mg and taper by 5 mg every week.  I also discussed the option of trying on Plaquenil or methotrexate.  Patient would prefer to proceed with Plaquenil.  Indications side effects contraindications were discussed.  She will be started on Plaquenil 200 mg p.o. twice daily Monday to Friday.  She will need baseline eye examination and then eye exam on yearly basis.  We will check labs in a month and then every 3 months.  High risk medication use-she will be starting Plaquenil today.  If she has an adequate response to  Plaquenil we may have to proceed with methotrexate.  Chronic pain of both shoulders-she has been having repeat recurrent episodic pain in her bilateral shoulders to the point she cannot lift her arms.  Pain in both hands -patient complains of discomfort in her bilateral hands.  She has synovitis over her left wrist joint and her left MCP.  Plan: XR Hand 2 View Right, XR Hand 2 View Left  Pain in both feet -she has episodic discomfort in her feet.  Plan: XR Foot 2 Views Right, XR Foot 2 Views Left  Primary insomnia - Trazodone  Orders: Orders Placed This Encounter  Procedures  . XR Hand 2 View Right  . XR Hand 2 View Left  . XR Foot 2 Views Right  . XR Foot 2 Views Left  . G6PD  . CMP  . Hepatitis B core antibody, IgM  . Hepatitis B surface antigen  . Hepatitis C antibody  . HIV antibody  . QuantiFERON-TB Gold Plus  . Serum protein electrophoresis with reflex  . Immunoglobulins   Meds ordered this encounter  Medications  . predniSONE (DELTASONE) 5 MG tablet    Sig: Take 4 tablets (20 mg total) by mouth daily for 4 days, THEN 3 tablets (15 mg total) daily for 4 days, THEN 2 tablets (10 mg total) daily for 4 days, THEN 1 tablet (5 mg total) daily for 4 days, THEN 0.5 tablets (2.5 mg total) daily for 4 days.    Dispense:  42 tablet    Refill:  0    Face-to-face time spent with patient was 60 minutes. Greater than 50% of time was spent in counseling and coordination of care.  Follow-Up Instructions: Return for Rheumatoid arthritis.   Pollyann Savoy, MD  Note - This record has been created using Animal nutritionist.  Chart creation errors have been sought, but may not always  have been located. Such creation errors do not reflect on  the standard of medical care.

## 2019-05-06 LAB — COMPLETE METABOLIC PANEL WITH GFR
AG Ratio: 1.9 (calc) (ref 1.0–2.5)
ALT: 11 U/L (ref 6–29)
AST: 17 U/L (ref 10–35)
Albumin: 4.4 g/dL (ref 3.6–5.1)
Alkaline phosphatase (APISO): 77 U/L (ref 37–153)
BUN: 23 mg/dL (ref 7–25)
CO2: 30 mmol/L (ref 20–32)
Calcium: 9.4 mg/dL (ref 8.6–10.4)
Chloride: 103 mmol/L (ref 98–110)
Creat: 0.8 mg/dL (ref 0.50–1.05)
GFR, Est African American: 96 mL/min/{1.73_m2} (ref >=60)
GFR, Est Non African American: 82 mL/min/{1.73_m2} (ref >=60)
Globulin: 2.3 g/dL (calc) (ref 1.9–3.7)
Glucose, Bld: 78 mg/dL (ref 65–99)
Potassium: 4 mmol/L (ref 3.5–5.3)
Sodium: 142 mmol/L (ref 135–146)
Total Bilirubin: 0.2 mg/dL (ref 0.2–1.2)
Total Protein: 6.7 g/dL (ref 6.1–8.1)

## 2019-05-06 LAB — IGG, IGA, IGM
IgG (Immunoglobin G), Serum: 921 mg/dL (ref 600–1640)
IgM, Serum: 82 mg/dL (ref 50–300)
Immunoglobulin A: 272 mg/dL (ref 47–310)

## 2019-05-06 LAB — QUANTIFERON-TB GOLD PLUS
Mitogen-NIL: >10 IU/mL
NIL: 0.03 IU/mL
QuantiFERON-TB Gold Plus: NEGATIVE
TB1-NIL: <0 IU/mL
TB2-NIL: 0 IU/mL

## 2019-05-06 LAB — HIV ANTIBODY (ROUTINE TESTING W REFLEX): HIV 1&2 Ab, 4th Generation: NONREACTIVE

## 2019-05-06 LAB — CBC WITH DIFFERENTIAL/PLATELET
Absolute Monocytes: 342 cells/uL (ref 200–950)
Basophils Absolute: 18 cells/uL (ref 0–200)
Basophils Relative: 0.3 %
Eosinophils Absolute: 100 cells/uL (ref 15–500)
Eosinophils Relative: 1.7 %
HCT: 36.8 % (ref 35.0–45.0)
Hemoglobin: 12.3 g/dL (ref 11.7–15.5)
Lymphs Abs: 1186 cells/uL (ref 850–3900)
MCH: 31.1 pg (ref 27.0–33.0)
MCHC: 33.4 g/dL (ref 32.0–36.0)
MCV: 92.9 fL (ref 80.0–100.0)
MPV: 9.3 fL (ref 7.5–12.5)
Monocytes Relative: 5.8 %
Neutro Abs: 4254 cells/uL (ref 1500–7800)
Neutrophils Relative %: 72.1 %
Platelets: 285 10*3/uL (ref 140–400)
RBC: 3.96 10*6/uL (ref 3.80–5.10)
RDW: 11.6 % (ref 11.0–15.0)
Total Lymphocyte: 20.1 %
WBC: 5.9 10*3/uL (ref 3.8–10.8)

## 2019-05-06 LAB — PROTEIN ELECTROPHORESIS, SERUM, WITH REFLEX
Albumin ELP: 3.8 g/dL (ref 3.8–4.8)
Alpha 1: 0.3 g/dL (ref 0.2–0.3)
Alpha 2: 0.9 g/dL (ref 0.5–0.9)
Beta 2: 0.4 g/dL (ref 0.2–0.5)
Beta Globulin: 0.4 g/dL (ref 0.4–0.6)
Gamma Globulin: 0.9 g/dL (ref 0.8–1.7)
Total Protein: 6.8 g/dL (ref 6.1–8.1)

## 2019-05-06 LAB — HEPATITIS C ANTIBODY
Hepatitis C Ab: NONREACTIVE
SIGNAL TO CUT-OFF: 0.02 (ref <1.00)

## 2019-05-06 LAB — GLUCOSE 6 PHOSPHATE DEHYDROGENASE: G-6PDH: 14.6 U/g Hgb (ref 7.0–20.5)

## 2019-05-06 LAB — HEPATITIS B CORE ANTIBODY, IGM: Hep B C IgM: NONREACTIVE

## 2019-05-06 LAB — HEPATITIS B SURFACE ANTIGEN: Hepatitis B Surface Ag: NONREACTIVE

## 2019-05-06 NOTE — Progress Notes (Signed)
The plan was to start patient on Plaquenil.  Please review notes and order the prescription as needed.  She will also need a standing orders.

## 2019-05-08 ENCOUNTER — Other Ambulatory Visit: Payer: Self-pay

## 2019-05-08 DIAGNOSIS — Z79899 Other long term (current) drug therapy: Secondary | ICD-10-CM

## 2019-05-08 DIAGNOSIS — M0579 Rheumatoid arthritis with rheumatoid factor of multiple sites without organ or systems involvement: Secondary | ICD-10-CM

## 2019-05-08 MED ORDER — HYDROXYCHLOROQUINE SULFATE 200 MG PO TABS: ORAL_TABLET | ORAL | 0 refills | Status: DC

## 2019-05-23 ENCOUNTER — Ambulatory Visit: Payer: BC Managed Care – PPO | Admitting: Rheumatology

## 2019-05-29 ENCOUNTER — Telehealth: Payer: Self-pay

## 2019-05-29 ENCOUNTER — Telehealth: Payer: Self-pay | Admitting: Rheumatology

## 2019-05-29 ENCOUNTER — Other Ambulatory Visit: Payer: Self-pay | Admitting: Rheumatology

## 2019-05-29 DIAGNOSIS — M0579 Rheumatoid arthritis with rheumatoid factor of multiple sites without organ or systems involvement: Secondary | ICD-10-CM

## 2019-05-29 NOTE — Telephone Encounter (Signed)
It is okay to wait till February if she does not have any other options.

## 2019-05-29 NOTE — Telephone Encounter (Signed)
Last Visit: 05/02/2019 Next Visit: 06/03/2019 Labs: 05/02/2019 WNL   Attempted to contact patient and left message on machine to advise patient labs need to be rechecked after being on PLQ for 1 month, prior to refill.

## 2019-05-29 NOTE — Telephone Encounter (Signed)
Advised patient It is okay to wait till February if she does not have any other options. Patient verbalized understanding.

## 2019-05-29 NOTE — Telephone Encounter (Signed)
Patient left a voicemail stating the first available appointment for her Plaquenil eye exam is 07/16/18.  Patient wants to make sure that the date is not too far out since she began her medication on 05/09/19.

## 2019-05-29 NOTE — Telephone Encounter (Signed)
Patient states she has been taking plaquenil since 05/09/2019. Patient states she started PLQ while on prednisone as well and patient states her heart has "felt funny" and she was lightheaded. Patient states she finished prednisone last week and her heart has not "raced" as much since being off of prednisone. Per patient, this does not happen on a daily basis, it is intermittent. Patient has a virtual new patient follow up on 06/03/2019. Please advise.

## 2019-05-29 NOTE — Telephone Encounter (Signed)
I returned patient's call and discussed that palpitations are likely related to Plaquenil and most likely related to prednisone.  She states she had mild sensation this morning while she is off prednisone.  Her joints are doing really well right now without the prednisone.  I offered that I can refer her to cardiology for evaluation and EKG but she wants to hold off.  Have advised her to contact me in case her symptoms persist.  Other treatment options were also discussed.  She has a virtual visit on Monday.

## 2019-05-30 NOTE — Progress Notes (Signed)
Virtual Visit via Video Note  I connected with Carmen Christensen on 06/03/19 at  8:00 AM EST by a video enabled telemedicine application and verified that I am speaking with the correct person using two identifiers.  Location: Patient: Home Provider: Clinic  This service was conducted via virtual visit.  Both audio and visual tools were used.  The patient was located at home. I was located in my office.  Consent was obtained prior to the virtual visit and is aware of possible charges through their insurance for this visit.  The patient is an established patient.  Dr. Estanislado Pandy, MD conducted the virtual visit and Hazel Sams, PA-C acted as scribe during the service.  Office staff helped with scheduling follow up visits after the service was conducted.   I discussed the limitations of evaluation and management by telemedicine and the availability of in person appointments. The patient expressed understanding and agreed to proceed.  CC: Medication monitoring  History of Present Illness: Patient is a 58 year old female with a past medical history of seropositive rheumatoid arthritis.  She is taking plaquenil 200 mg 1 tablet BID M-F, which she started 3 weeks ago.  She is tolerating PLQ without any side effects.  She completed a prednisone taper 10 days ago.  She states her wrist and hand inflammation resolved while taking prednisone.  She has persistent discomfort in both hands and both feet.  She has occasional discomfort in both shoulder joints.   Review of Systems  Constitutional: Negative for fever and malaise/fatigue.  Eyes: Negative for photophobia, pain, discharge and redness.  Respiratory: Negative for cough, shortness of breath and wheezing.   Cardiovascular: Negative for chest pain and palpitations.  Gastrointestinal: Negative for blood in stool, constipation and diarrhea.  Genitourinary: Negative for dysuria.  Musculoskeletal: Positive for joint pain. Negative for back pain, myalgias and  neck pain.  Skin: Negative for rash.  Neurological: Negative for dizziness and headaches.  Psychiatric/Behavioral: Negative for depression. The patient is not nervous/anxious and does not have insomnia.       Observations/Objective: Physical Exam  Constitutional: She is oriented to person, place, and time and well-developed, well-nourished, and in no distress.  HENT:  Head: Normocephalic and atraumatic.  Eyes: Conjunctivae are normal.  Pulmonary/Chest: Effort normal.  Neurological: She is alert and oriented to person, place, and time.  Psychiatric: Mood, memory, affect and judgment normal.   Patient reports morning stiffness for 2  minutes.   Patient reports nocturnal pain.  Difficulty dressing/grooming: Denies Difficulty climbing stairs: Denies Difficulty getting out of chair: Denies Difficulty using hands for taps, buttons, cutlery, and/or writing: Reports  She currently rates her RA a 1/10.   Assessment and Plan: Visit Diagnoses: Rheumatoid arthritis involving multiple sites with positive rheumatoid factor ([place]) - 04/22/19: ANA-, sed rate 75, RF 62.5.  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 She has intermittent discomfort in both shoulder joints.  Her joint inflammation has resolved.  She is taking Plaquenil 200 mg 1 tablet by mouth twice daily M-F (started at end of November). She is tolerating PLQ without any side effects. Her joint pain and inflammation resolved while on the prednisone taper, which she completed on December 10th.  She has morning stiffness for about 2 minutes daily.  She will continue on the current treatment regimen.  She does not need any refills at this time.  She was advised to notify us if she  develops increased joint pain or inflammation.  She will follow up in 2 months.   High risk medication use-Plaquenil 200 mg 1 tablet by mouth twice daily M-F (started at end of November  2020).  CBC and CMP WNL on 05/02/19.  She will require lab work 1 month after starting on PLQ then in 3 months then every 5 months.   Standing orders are in place.  She has a baseline eye exam scheduled for 07/18/19.   Vitamin D deficiency-H/o-Ordered by PCP.  Primary insomnia: She takes Trazodone 50 mg 1 tablet by mouth at bedtime for insomnia.   Follow Up Instructions: She will follow up in 2 months.    I discussed the assessment and treatment plan with the patient. The patient was provided an opportunity to ask questions and all were answered. The patient agreed with the plan and demonstrated an understanding of the instructions.   The patient was advised to call back or seek an in-person evaluation if the symptoms worsen or if the condition fails to improve as anticipated.  I provided 30 minutes of non-face-to-face time during this encounter.   Pollyann Savoy, MD   Scribed by-  Sherron Ales, PA-C

## 2019-06-03 ENCOUNTER — Telehealth (INDEPENDENT_AMBULATORY_CARE_PROVIDER_SITE_OTHER): Payer: BC Managed Care – PPO | Admitting: Rheumatology

## 2019-06-03 ENCOUNTER — Encounter: Payer: Self-pay | Admitting: Rheumatology

## 2019-06-03 ENCOUNTER — Other Ambulatory Visit: Payer: Self-pay

## 2019-06-03 ENCOUNTER — Other Ambulatory Visit: Payer: Self-pay | Admitting: *Deleted

## 2019-06-03 DIAGNOSIS — M0579 Rheumatoid arthritis with rheumatoid factor of multiple sites without organ or systems involvement: Secondary | ICD-10-CM | POA: Diagnosis not present

## 2019-06-03 DIAGNOSIS — E559 Vitamin D deficiency, unspecified: Secondary | ICD-10-CM

## 2019-06-03 DIAGNOSIS — F5101 Primary insomnia: Secondary | ICD-10-CM

## 2019-06-03 DIAGNOSIS — Z79899 Other long term (current) drug therapy: Secondary | ICD-10-CM

## 2019-06-03 MED ORDER — HYDROXYCHLOROQUINE SULFATE 200 MG PO TABS: ORAL_TABLET | ORAL | 0 refills | Status: DC

## 2019-06-05 ENCOUNTER — Telehealth: Payer: Self-pay | Admitting: Rheumatology

## 2019-06-05 NOTE — Telephone Encounter (Signed)
-----   Message from Shona Needles, RT sent at 06/03/2019  2:38 PM EST ----- Regarding: 2 MONTH F/U

## 2019-06-05 NOTE — Telephone Encounter (Signed)
LMOM for patient to call and schedule 2 month follow-up appointment. °

## 2019-06-20 ENCOUNTER — Other Ambulatory Visit: Payer: Self-pay

## 2019-06-20 DIAGNOSIS — Z79899 Other long term (current) drug therapy: Secondary | ICD-10-CM

## 2019-06-21 LAB — COMPLETE METABOLIC PANEL WITH GFR
AG Ratio: 1.9 (calc) (ref 1.0–2.5)
ALT: 11 U/L (ref 6–29)
AST: 16 U/L (ref 10–35)
Albumin: 4.3 g/dL (ref 3.6–5.1)
Alkaline phosphatase (APISO): 55 U/L (ref 37–153)
BUN: 18 mg/dL (ref 7–25)
CO2: 30 mmol/L (ref 20–32)
Calcium: 9.4 mg/dL (ref 8.6–10.4)
Chloride: 103 mmol/L (ref 98–110)
Creat: 0.74 mg/dL (ref 0.50–1.05)
GFR, Est African American: 105 mL/min/{1.73_m2} (ref >=60)
GFR, Est Non African American: 91 mL/min/{1.73_m2} (ref >=60)
Globulin: 2.3 g/dL (calc) (ref 1.9–3.7)
Glucose, Bld: 72 mg/dL (ref 65–139)
Potassium: 4.1 mmol/L (ref 3.5–5.3)
Sodium: 139 mmol/L (ref 135–146)
Total Bilirubin: 0.3 mg/dL (ref 0.2–1.2)
Total Protein: 6.6 g/dL (ref 6.1–8.1)

## 2019-06-21 LAB — CBC WITH DIFFERENTIAL/PLATELET
Absolute Monocytes: 411 cells/uL (ref 200–950)
Basophils Absolute: 31 cells/uL (ref 0–200)
Basophils Relative: 0.6 %
Eosinophils Absolute: 52 cells/uL (ref 15–500)
Eosinophils Relative: 1 %
HCT: 36.8 % (ref 35.0–45.0)
Hemoglobin: 12.7 g/dL (ref 11.7–15.5)
Lymphs Abs: 1295 cells/uL (ref 850–3900)
MCH: 32 pg (ref 27.0–33.0)
MCHC: 34.5 g/dL (ref 32.0–36.0)
MCV: 92.7 fL (ref 80.0–100.0)
MPV: 10.1 fL (ref 7.5–12.5)
Monocytes Relative: 7.9 %
Neutro Abs: 3411 cells/uL (ref 1500–7800)
Neutrophils Relative %: 65.6 %
Platelets: 255 10*3/uL (ref 140–400)
RBC: 3.97 10*6/uL (ref 3.80–5.10)
RDW: 11.7 % (ref 11.0–15.0)
Total Lymphocyte: 24.9 %
WBC: 5.2 10*3/uL (ref 3.8–10.8)

## 2019-06-29 ENCOUNTER — Other Ambulatory Visit: Payer: Self-pay | Admitting: Rheumatology

## 2019-06-29 DIAGNOSIS — M0579 Rheumatoid arthritis with rheumatoid factor of multiple sites without organ or systems involvement: Secondary | ICD-10-CM

## 2019-07-01 NOTE — Telephone Encounter (Signed)
Last Visit: 05/02/2019 Next Visit: 08/01/2019 Labs: 06/20/2019 CBC and CMP WNL  baseline eye exam scheduled for 07/18/19.   Okay to refill PLQ?

## 2019-07-23 ENCOUNTER — Other Ambulatory Visit: Payer: Self-pay | Admitting: Rheumatology

## 2019-07-23 DIAGNOSIS — M0579 Rheumatoid arthritis with rheumatoid factor of multiple sites without organ or systems involvement: Secondary | ICD-10-CM

## 2019-07-23 NOTE — Telephone Encounter (Signed)
Last Visit:05/02/2019 Next Visit: 08/01/2019 Labs: 06/20/2019 CBC and CMP WNL  PLQ Eye exam: 07/18/19 WNL  Okay to refill per Dr. Corliss Skains

## 2019-07-26 NOTE — Progress Notes (Deleted)
   Office Visit Note  Patient: Carmen Christensen             Date of Birth: 10-Apr-1961           MRN: [id number]             PCP: Tracey Harries, MD Referring: Tracey Harries, MD Visit Date: 08/01/2019 Occupation: @GUAROCC @  Subjective:  No chief complaint on file.   History of Present Illness: [name] is a 59 y.o. female ***   Activities of Daily Living:  Patient reports morning stiffness for *** {minute/hour:19697}.   Patient {ACTIONS;DENIES/REPORTS:21021675::"Denies"} nocturnal pain.  Difficulty dressing/grooming: {ACTIONS;DENIES/REPORTS:21021675::"Denies"} Difficulty climbing stairs: {ACTIONS;DENIES/REPORTS:21021675::"Denies"} Difficulty getting out of chair: {ACTIONS;DENIES/REPORTS:21021675::"Denies"} Difficulty using hands for taps, buttons, cutlery, and/or writing: {ACTIONS;DENIES/REPORTS:21021675::"Denies"}  No Rheumatology ROS completed.   PMFS History:  There are no problems to display for this patient.   No past medical history on file.  Family History  Problem Relation Age of Onset  . Colon cancer Paternal Grandfather        not sure age of onset   Past Surgical History:  Procedure Laterality Date  . DILATION AND CURETTAGE OF UTERUS  2000  . NASAL SINUS SURGERY  1993   Social History   Social History Narrative  . Not on file    There is no immunization history on file for this patient.   Objective: Vital Signs: There were no vitals taken for this visit.   Physical Exam   Musculoskeletal Exam: ***  CDAI Exam: CDAI Score: -- Patient Global: --; Provider Global: -- Swollen: --; Tender: -- Joint Exam 08/01/2019   No joint exam has been documented for this visit   There is currently no information documented on the homunculus. Go to the Rheumatology activity and complete the homunculus joint exam.  Investigation: No additional findings.  Imaging: No results found.  Recent Labs: Lab Results  Component Value Date   WBC 5.2 06/20/2019    HGB 12.7 06/20/2019   PLT 255 06/20/2019   NA 139 06/20/2019   K 4.1 06/20/2019   CL 103 06/20/2019   CO2 30 06/20/2019   GLUCOSE 72 06/20/2019   BUN 18 06/20/2019   CREATININE 0.74 06/20/2019   BILITOT 0.3 06/20/2019   AST 16 06/20/2019   ALT 11 06/20/2019   PROT 6.6 06/20/2019   CALCIUM 9.4 06/20/2019   GFRAA 105 06/20/2019   QFTBGOLDPLUS NEGATIVE 05/02/2019    Speciality Comments: PLQ Eye Exam: 07/18/19 WNL @ [facility] follow up in 6 months  Procedures:  No procedures performed Allergies: Other   Assessment / Plan:     Visit Diagnoses: No diagnosis found.  Orders: No orders of the defined types were placed in this encounter.  No orders of the defined types were placed in this encounter.   Face-to-face time spent with patient was *** minutes. Greater than 50% of time was spent in counseling and coordination of care.  Follow-Up Instructions: No follow-ups on file.   09/15/19, PA-C  Note - This record has been created using Dragon software.  Chart creation errors have been sought, but may not always  have been located. Such creation errors do not reflect on  the standard of medical care.

## 2019-08-01 ENCOUNTER — Ambulatory Visit: Payer: BC Managed Care – PPO | Admitting: Rheumatology

## 2019-08-02 ENCOUNTER — Other Ambulatory Visit: Payer: Self-pay

## 2019-08-02 ENCOUNTER — Ambulatory Visit (INDEPENDENT_AMBULATORY_CARE_PROVIDER_SITE_OTHER): Payer: BC Managed Care – PPO | Admitting: Rheumatology

## 2019-08-02 ENCOUNTER — Encounter: Payer: Self-pay | Admitting: Rheumatology

## 2019-08-02 VITALS — BP 127/72 | HR 74 | Resp 13 | Ht 61.0 in | Wt 125.4 lb

## 2019-08-02 DIAGNOSIS — F5101 Primary insomnia: Secondary | ICD-10-CM | POA: Diagnosis not present

## 2019-08-02 DIAGNOSIS — M0579 Rheumatoid arthritis with rheumatoid factor of multiple sites without organ or systems involvement: Secondary | ICD-10-CM | POA: Diagnosis not present

## 2019-08-02 DIAGNOSIS — M722 Plantar fascial fibromatosis: Secondary | ICD-10-CM

## 2019-08-02 DIAGNOSIS — M8589 Other specified disorders of bone density and structure, multiple sites: Secondary | ICD-10-CM

## 2019-08-02 DIAGNOSIS — Z79899 Other long term (current) drug therapy: Secondary | ICD-10-CM

## 2019-08-02 DIAGNOSIS — E559 Vitamin D deficiency, unspecified: Secondary | ICD-10-CM | POA: Diagnosis not present

## 2019-08-02 NOTE — Patient Instructions (Signed)
Plantar Fasciitis Rehab Ask your health care provider which exercises are safe for you. Do exercises exactly as told by your health care provider and adjust them as directed. It is normal to feel mild stretching, pulling, tightness, or discomfort as you do these exercises. Stop right away if you feel sudden pain or your pain gets worse. Do not begin these exercises until told by your health care provider. Stretching and range-of-motion exercises These exercises warm up your muscles and joints and improve the movement and flexibility of your foot. These exercises also help to relieve pain. Plantar fascia stretch  1. Sit with your left / right leg crossed over your opposite knee. 2. Hold your heel with one hand with that thumb near your arch. With your other hand, hold your toes and gently pull them back toward the top of your foot. You should feel a stretch on the bottom of your toes or your foot (plantar fascia) or both. 3. Hold this stretch for__________ seconds. 4. Slowly release your toes and return to the starting position. Repeat __________ times. Complete this exercise __________ times a day. Gastrocnemius stretch, standing This exercise is also called a calf (gastroc) stretch. It stretches the muscles in the back of the upper calf. 1. Stand with your hands against a wall. 2. Extend your left / right leg behind you, and bend your front knee slightly. 3. Keeping your heels on the floor and your back knee straight, shift your weight toward the wall. Do not arch your back. You should feel a gentle stretch in your upper left / right calf. 4. Hold this position for __________ seconds. Repeat __________ times. Complete this exercise __________ times a day. Soleus stretch, standing This exercise is also called a calf (soleus) stretch. It stretches the muscles in the back of the lower calf. 1. Stand with your hands against a wall. 2. Extend your left / right leg behind you, and bend your front  knee slightly. 3. Keeping your heels on the floor, bend your back knee and shift your weight slightly over your back leg. You should feel a gentle stretch deep in your lower calf. 4. Hold this position for __________ seconds. Repeat __________ times. Complete this exercise __________ times a day. Gastroc and soleus stretch, standing step This exercise stretches the muscles in the back of the lower leg. These muscles are in the upper calf (gastrocnemius) and the lower calf (soleus). 1. Stand with the ball of your left / right foot on a step. The ball of your foot is on the walking surface, right under your toes. 2. Keep your other foot firmly on the same step. 3. Hold on to the wall or a railing for balance. 4. Slowly lift your other foot, allowing your body weight to press your left / right heel down over the edge of the step. You should feel a stretch in your left / right calf. 5. Hold this position for __________ seconds. 6. Return both feet to the step. 7. Repeat this exercise with a slight bend in your left / right knee. Repeat __________ times with your left / right knee straight and __________ times with your left / right knee bent. Complete this exercise __________ times a day. Balance exercise This exercise builds your balance and strength control of your arch to help take pressure off your plantar fascia. Single leg stand If this exercise is too easy, you can try it with your eyes closed or while standing on a pillow. 1.   Without shoes, stand near a railing or in a doorway. You may hold on to the railing or door frame as needed. 2. Stand on your left / right foot. Keep your big toe down on the floor and try to keep your arch lifted. Do not let your foot roll inward. 3. Hold this position for __________ seconds. Repeat __________ times. Complete this exercise __________ times a day. This information is not intended to replace advice given to you by your health care provider. Make sure  you discuss any questions you have with your health care provider. Document Revised: 09/20/2018 Document Reviewed: 03/28/2018 Elsevier Patient Education  2020 [employer]  

## 2019-08-02 NOTE — Progress Notes (Signed)
Office Visit Note  Patient: Carmen Christensen             Date of Birth: 23-Jan-1961           MRN: [id number]             PCP: Tracey Harries, MD Referring: Tracey Harries, MD Visit Date: 08/02/2019 Occupation: @GUAROCC @  Subjective:  Medication monitoring    History of Present Illness: Carmen Christensen is a 59 y.o. female with history of seropositive rheumatoid arthritis.  She is taking plaquenil 200 mg 1 tablet BID M-F ,which she started at the end of November 2020.  She discontinued prednisone taper on 05/24/2019.  She states that her last flare was in the flexor tendons of the left wrist at the end of December/early January.  She denies any joint swelling at this time.  She continues to have intermittent pain in both hands especially when picking up objects.  She is also developed plantar fasciitis in the left foot 2 months ago.  She denies getting any new shoes recently.  She has been performing stretching exercises without much relief. She reports that she had a black eye exam on 07/18/2019 and a DEXA on 07/19/2019.   Activities of Daily Living:  Patient reports morning stiffness for 5 minutes.   Patient Reports nocturnal pain.  Difficulty dressing/grooming: Denies Difficulty climbing stairs: Denies Difficulty getting out of chair: Denies Difficulty using hands for taps, buttons, cutlery, and/or writing: Denies  Review of Systems  Constitutional: Positive for fatigue.  HENT: Negative for mouth sores, mouth dryness and nose dryness.   Eyes: Positive for dryness. Negative for pain and itching.  Respiratory: Negative for shortness of breath and difficulty breathing.   Cardiovascular: Negative for chest pain and palpitations.  Gastrointestinal: Negative for blood in stool, constipation and diarrhea.  Endocrine: Negative for increased urination.  Genitourinary: Negative for difficulty urinating and painful urination.  Musculoskeletal: Positive for arthralgias, joint pain, joint swelling  and morning stiffness.  Skin: Negative for rash.  Allergic/Immunologic: Negative for susceptible to infections.  Neurological: Negative for dizziness, headaches, memory loss and weakness.  Hematological: Negative for bruising/bleeding tendency.  Psychiatric/Behavioral: Negative for confusion.    PMFS History:  There are no problems to display for this patient.   History reviewed. No pertinent past medical history.  Family History  Problem Relation Age of Onset  . Colon cancer Paternal Grandfather        not sure age of onset  . Cataracts Father   . Healthy Son   . Healthy Son    Past Surgical History:  Procedure Laterality Date  . DILATION AND CURETTAGE OF UTERUS  2000  . NASAL SINUS SURGERY  1993   Social History   Social History Narrative  . Not on file    There is no immunization history on file for this patient.   Objective: Vital Signs: BP 127/72 (BP Location: Left Arm, Patient Position: Sitting, Cuff Size: Normal)   Pulse 74   Resp 13   Ht 5\' 1"  (1.549 m)   Wt 125 lb 6.4 oz (56.9 kg)   LMP 02/26/2014   BMI 23.69 kg/m    Physical Exam Vitals and nursing note reviewed.  Constitutional:      Appearance: She is well-developed.  HENT:     Head: Normocephalic and atraumatic.  Eyes:     Conjunctiva/sclera: Conjunctivae normal.  Pulmonary:     Effort: Pulmonary effort is normal.  Abdominal:     General:  Bowel sounds are normal.     Palpations: Abdomen is soft.  Musculoskeletal:     Cervical back: Normal range of motion.  Lymphadenopathy:     Cervical: No cervical adenopathy.  Skin:    General: Skin is warm and dry.     Capillary Refill: Capillary refill takes less than 2 seconds.  Neurological:     Mental Status: She is alert and oriented to person, place, and time.  Psychiatric:        Behavior: Behavior normal.      Musculoskeletal Exam: C-spine, thoracic spine, spine good range of motion.  No midline spinal tenderness.  Shoulder joints, but  joints, wrist joints, MCPs, PIPs and DIPs good range of motion no synovitis.  She has complete fist formation bilaterally.  Hip joints, knee joints, ankle joints, MTPs, PIPs and DIPs good range of motion no synovitis.  No warmth or effusion of bilateral knee joints.  No tenderness or swelling of ankle joints.  Tenderness along the plantar fascia of the left foot.  No MTP joint tenderness.  CDAI Exam: CDAI Score: -- Patient Global: --; Provider Global: -- Swollen: --; Tender: -- Joint Exam 08/02/2019   No joint exam has been documented for this visit   There is currently no information documented on the homunculus. Go to the Rheumatology activity and complete the homunculus joint exam.  Investigation: No additional findings.  Imaging: No results found.  Recent Labs: Lab Results  Component Value Date   WBC 5.2 06/20/2019   HGB 12.7 06/20/2019   PLT 255 06/20/2019   NA 139 06/20/2019   K 4.1 06/20/2019   CL 103 06/20/2019   CO2 30 06/20/2019   GLUCOSE 72 06/20/2019   BUN 18 06/20/2019   CREATININE 0.74 06/20/2019   BILITOT 0.3 06/20/2019   AST 16 06/20/2019   ALT 11 06/20/2019   PROT 6.6 06/20/2019   CALCIUM 9.4 06/20/2019   GFRAA 105 06/20/2019   QFTBGOLDPLUS NEGATIVE 05/02/2019    Speciality Comments: PLQ Eye Exam: 07/18/19 WNL @ [facility] follow up in 6 months  Procedures:  No procedures performed Allergies: Patient has no active allergies.   Assessment / Plan:     Visit Diagnoses: Rheumatoid arthritis involving multiple sites with positive rheumatoid factor ([facility]) - 04/22/19: ANA-, sed rate 75, RF 62.5.  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  Her last flare was at the end of December/early January 2021 when she experienced tenderness and inflammation in the flexor tendons of the left wrist.  The patient brought a photograph of the inflammation experienced which was consistent  with flexor tenosynovitis of the left wrist.  She has good range of motion of both wrist joints on exam today.  No tenderness, tenosynovitis or joint synovitis was noted.  She has been taking Plaquenil 200 mg 1 tablet twice daily Monday through Friday for almost 3 months.  She has been tolerating Plaquenil without any side effects.  She continues to have intermittent pain in both hands and both wrist joints exacerbated by picking up objects.  She has no other joint pain or joint swelling at this time.  We discussed that she may require more aggressive treatment in the future if she continues to have recurrent flares.  She is apprehensive to start on methotrexate at this time.  She will continue taking Plaquenil as prescribed.  We will add a sed rate to her future orders to assess  for inflammation.  She was advised to notify us if she develops increased joint pain or joint swelling.  She will follow-up in the office in 3 months.  High risk medication use - Plaquenil 200 mg 1 tablet by mouth twice daily M-F (started at end of November 2020).  Plaquenil eye exam normal on 07/18/2019.  CBC and CMP were within normal limits on 06/20/2019.  She will be due to update lab work in April.  Standing orders are in place.  Future order for sed rate was placed today.  Primary insomnia: She takes trazodone 50 mg 1 tablet by mouth at bedtime for insomnia.  Vitamin D deficiency: She has started taking vitamin D 1000 units daily.  Plantar fasciitis, left: She has tenderness along the plantar fascia of the left foot.  She has been experiencing discomfort for the past 2 months.  She has been performing stretching exercises.  We discussed treatment options.  She declined a cortisone injection.  We discussed the use of Voltaren gel topically.  We also discussed the importance of wearing proper fitting shoes.  She was given a handout of exercises to perform.  Osteopenia of multiple sites: DEXA 07/19/19: Left distal radius: T-score  -2.2. She has started taking calcium and vitamin D supplements.    Orders: Orders Placed This Encounter  Procedures  . Sedimentation rate   No orders of the defined types were placed in this encounter.   Face-to-face time spent with patient was 30 minutes. Greater than 50% of time was spent in counseling and coordination of care.  Follow-Up Instructions: Return in about 3 months (around 10/30/2019) for Rheumatoid arthritis.   Hazel Sams, PA-C  I examined and evaluated the patient with Hazel Sams PA.  Patient had no synovitis on my examination.  She has been on Plaquenil almost 3 months now.  She states she has intermittent discomfort in her hands and wrist joints with certain activities.  We discussed the option of possibly adding methotrexate.  At this point she would like to hold off and continue Plaquenil.  We will reassess the situation in 3 months at the follow-up visit.  The plan of care was discussed as noted above.  Bo Merino, MD  Note - This record has been created using Editor, commissioning.  Chart creation errors have been sought, but may not always  have been located. Such creation errors do not reflect on  the standard of medical care.

## 2019-09-17 ENCOUNTER — Other Ambulatory Visit: Payer: Self-pay

## 2019-09-17 DIAGNOSIS — M0579 Rheumatoid arthritis with rheumatoid factor of multiple sites without organ or systems involvement: Secondary | ICD-10-CM

## 2019-09-17 DIAGNOSIS — Z79899 Other long term (current) drug therapy: Secondary | ICD-10-CM

## 2019-09-17 LAB — COMPLETE METABOLIC PANEL WITH GFR
AG Ratio: 1.9 (calc) (ref 1.0–2.5)
ALT: 13 U/L (ref 6–29)
AST: 16 U/L (ref 10–35)
Albumin: 4.2 g/dL (ref 3.6–5.1)
Alkaline phosphatase (APISO): 56 U/L (ref 37–153)
BUN: 21 mg/dL (ref 7–25)
CO2: 29 mmol/L (ref 20–32)
Calcium: 9.4 mg/dL (ref 8.6–10.4)
Chloride: 103 mmol/L (ref 98–110)
Creat: 0.72 mg/dL (ref 0.50–1.05)
GFR, Est African American: 108 mL/min/{1.73_m2} (ref >=60)
GFR, Est Non African American: 94 mL/min/{1.73_m2} (ref >=60)
Globulin: 2.2 g/dL (calc) (ref 1.9–3.7)
Glucose, Bld: 87 mg/dL (ref 65–99)
Potassium: 4.1 mmol/L (ref 3.5–5.3)
Sodium: 139 mmol/L (ref 135–146)
Total Bilirubin: 0.4 mg/dL (ref 0.2–1.2)
Total Protein: 6.4 g/dL (ref 6.1–8.1)

## 2019-09-17 LAB — CBC WITH DIFFERENTIAL/PLATELET
Absolute Monocytes: 360 cells/uL (ref 200–950)
Basophils Absolute: 32 cells/uL (ref 0–200)
Basophils Relative: 0.7 %
Eosinophils Absolute: 90 cells/uL (ref 15–500)
Eosinophils Relative: 2 %
HCT: 36.8 % (ref 35.0–45.0)
Hemoglobin: 12.1 g/dL (ref 11.7–15.5)
Lymphs Abs: 1116 cells/uL (ref 850–3900)
MCH: 30.3 pg (ref 27.0–33.0)
MCHC: 32.9 g/dL (ref 32.0–36.0)
MCV: 92 fL (ref 80.0–100.0)
MPV: 10.1 fL (ref 7.5–12.5)
Monocytes Relative: 8 %
Neutro Abs: 2903 cells/uL (ref 1500–7800)
Neutrophils Relative %: 64.5 %
Platelets: 228 10*3/uL (ref 140–400)
RBC: 4 10*6/uL (ref 3.80–5.10)
RDW: 12.4 % (ref 11.0–15.0)
Total Lymphocyte: 24.8 %
WBC: 4.5 10*3/uL (ref 3.8–10.8)

## 2019-09-17 LAB — SEDIMENTATION RATE: Sed Rate: 19 mm/h (ref 0–30)

## 2019-09-18 NOTE — Progress Notes (Signed)
ESR WNL

## 2019-09-18 NOTE — Progress Notes (Signed)
CBC and CMP are normal.

## 2019-10-11 ENCOUNTER — Other Ambulatory Visit: Payer: Self-pay | Admitting: Rheumatology

## 2019-10-11 DIAGNOSIS — M0579 Rheumatoid arthritis with rheumatoid factor of multiple sites without organ or systems involvement: Secondary | ICD-10-CM

## 2019-10-11 NOTE — Telephone Encounter (Signed)
Last Visit: 08/02/2019 Next Visit: 10/31/2019 Labs: 09/17/2019 CBC and CMP are normal. Eye exam:  07/18/2019  Okay to refill per Dr. Corliss Skains.

## 2019-10-23 NOTE — Progress Notes (Signed)
Office Visit Note  Patient: Carmen Christensen             Date of Birth: 1960-11-14           MRN: [id number]             PCP: Tracey Harries, MD Referring: Tracey Harries, MD Visit Date: 10/31/2019 Occupation: @GUAROCC @  Subjective:  Medication monitoring   History of Present Illness: Carmen Christensen is a 59 y.o. female with history of seropositive rheumatoid arthritis.  Patient is taking Plaquenil 200 mg 1 tablet by mouth twice daily Monday through Friday which was started in November 2020.  She is tolerating Plaquenil without any side effects.  She has not missed any doses recently.  She states that her last flare was in January 2021.  She experiences occasional discomfort in bilateral CMC joints.  She rarely has discomfort in her shoulder joints or wrists like she used to.  She denies any joint swelling at this time.  She continues to have pain on the plantar aspect of her left heel.  She wears orthotics on a daily basis.  1000 mg vitamin C Collagen 600 mg, 300 mcg biotin Calcium 1200 mg and vitamin D 800 units  Activities of Daily Living:  Patient reports joint stiffness all day  Patient Reports nocturnal pain.  Difficulty dressing/grooming: Denies Difficulty climbing stairs: Denies Difficulty getting out of chair: Denies Difficulty using hands for taps, buttons, cutlery, and/or writing: Reports  Review of Systems  Constitutional: Positive for fatigue.  HENT: Negative for mouth sores, mouth dryness and nose dryness.   Eyes: Positive for dryness. Negative for pain and visual disturbance.  Respiratory: Negative for cough, hemoptysis, shortness of breath and difficulty breathing.   Cardiovascular: Negative for chest pain, palpitations, hypertension and swelling in legs/feet.  Gastrointestinal: Negative for blood in stool, constipation and diarrhea.  Genitourinary: Negative for painful urination.  Musculoskeletal: Positive for arthralgias, joint pain, muscle weakness and morning  stiffness. Negative for joint swelling, myalgias, muscle tenderness and myalgias.  Skin: Negative for color change, pallor, rash, hair loss, nodules/bumps, skin tightness, ulcers and sensitivity to sunlight.  Allergic/Immunologic: Negative for susceptible to infections.  Neurological: Negative for dizziness, numbness and headaches.  Hematological: Negative for bruising/bleeding tendency and swollen glands.  Psychiatric/Behavioral: Positive for sleep disturbance. Negative for depressed mood. The patient is not nervous/anxious.     PMFS History:  There are no problems to display for this patient.   Past Medical History:  Diagnosis Date  . Rheumatoid arthritis (HCC)     Family History  Problem Relation Age of Onset  . Colon cancer Paternal Grandfather        not sure age of onset  . Cataracts Father   . Healthy Son   . Healthy Son    Past Surgical History:  Procedure Laterality Date  . DILATION AND CURETTAGE OF UTERUS  2000  . NASAL SINUS SURGERY  1993   Social History   Social History Narrative  . Not on file    There is no immunization history on file for this patient.   Objective: Vital Signs: BP (!) 99/55 (BP Location: Left Arm, Patient Position: Sitting, Cuff Size: Large)   Pulse 83   Resp 14   Ht 5\' 1"  (1.549 m)   Wt 122 lb (55.3 kg)   LMP 02/26/2014   BMI 23.05 kg/m    Physical Exam Vitals and nursing note reviewed.  Constitutional:      Appearance: She is well-developed.  HENT:     Head: Normocephalic and atraumatic.  Eyes:     Conjunctiva/sclera: Conjunctivae normal.  Pulmonary:     Effort: Pulmonary effort is normal.  Abdominal:     General: Bowel sounds are normal.     Palpations: Abdomen is soft.  Musculoskeletal:     Cervical back: Normal range of motion.  Lymphadenopathy:     Cervical: No cervical adenopathy.  Skin:    General: Skin is warm and dry.     Capillary Refill: Capillary refill takes less than 2 seconds.  Neurological:     Mental  Status: She is alert and oriented to person, place, and time.  Psychiatric:        Behavior: Behavior normal.      Musculoskeletal Exam: C-spine, thoracic spine, lumbar spine good range of motion.  Shoulder joints, elbow joints, wrist joints, MCPs, PIPs, DIPs good range of motion with no synovitis.  She has synovial thickening of the right second and third MCP joints.  PIP and DIP thickening consistent with osteoarthritis of both hands.  She has tenderness of both CMC joints.  She has complete fist formation bilaterally.  Hip joints, knee joints and ankle joints MTPs, PIPs, DIPs good range of motion with no synovitis.  No warmth or effusion of bilateral knee joints.  No tenderness or swelling of ankle joints. Tenderness of the inferior calcaneal spur of the left foot noted.    CDAI Exam: CDAI Score: 0.4  Patient Global: 2 mm; Provider Global: 2 mm Swollen: 0 ; Tender: 0  Joint Exam 10/31/2019   No joint exam has been documented for this visit   There is currently no information documented on the homunculus. Go to the Rheumatology activity and complete the homunculus joint exam.  Investigation: No additional findings.  Imaging: No results found.  Recent Labs: Lab Results  Component Value Date   WBC 4.5 09/17/2019   HGB 12.1 09/17/2019   PLT 228 09/17/2019   NA 139 09/17/2019   K 4.1 09/17/2019   CL 103 09/17/2019   CO2 29 09/17/2019   GLUCOSE 87 09/17/2019   BUN 21 09/17/2019   CREATININE 0.72 09/17/2019   BILITOT 0.4 09/17/2019   AST 16 09/17/2019   ALT 13 09/17/2019   PROT 6.4 09/17/2019   CALCIUM 9.4 09/17/2019   GFRAA 108 09/17/2019   QFTBGOLDPLUS NEGATIVE 05/02/2019    Speciality Comments: PLQ Eye Exam: 07/18/19 WNL @ [facility] follow up in 6 months  Procedures:  No procedures performed Allergies: Patient has no known allergies.   Assessment / Plan:     Visit Diagnoses: Rheumatoid arthritis involving multiple sites with positive rheumatoid factor  (HCC) - 04/22/19: ANA-, sed rate 75, RF 62.5.  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  She has not had any recent rheumatoid arthritis flares.  Her last flare was in January 2021.  She experiences occasional discomfort in both shoulders, both wrists, both hands but has not noticed any joint inflammation.  She has tenderness of bilateral CMC joints due to underlying osteoarthritis.  Her sed rate was 19 on 09/17/2019.  Her rheumatoid arthritis is well controlled on Plaquenil 200 mg 1 tablet by mouth twice daily Monday through Friday.  She is tolerating Plaquenil without any side effects and has not missed any doses recently.  She will continue taking Plaquenil as prescribed.  She does not need any refills at this time.  She was  advised to notify us if she develops increased joint pain or joint swelling.  She will follow-up in the office in 5 months.  High risk medication use - Plaquenil 200 mg 1 tablet by mouth twice daily M-F (started at end of November 2020). PLQ Eye Exam: 07/18/19.  CBC and CMP were within normal limits on 09/17/2019.  She will return for lab work in September and every 5 months to monitor for drug toxicity.  Primary insomnia -She takes trazodone 50 mg 1 tablet by mouth at bedtime for insomnia.  Vitamin D deficiency: She is currently taking a calcium and vitamin D supplement.  Plantar fasciitis, left: She has tenderness at the insertion site of the plantar fascia.  The inferior calcaneal spur is palpable.  We discussed the importance of wearing proper fitting shoes with her orthotics.  She declined a cortisone injection at this time.  We discussed a referral to podiatrist she declined.  She was advised to notify us if her discomfort persists or worsens.  She was encouraged to continue to perform plantar fasciitis stretching exercises on a daily basis.  Osteopenia of multiple sites - DEXA 07/19/19: Left distal radius:  T-score -2.2.  She has started taking a calcium and vitamin D supplement on a daily basis.  We discussed the importance of resistive exercises.  Orders: No orders of the defined types were placed in this encounter.  No orders of the defined types were placed in this encounter.    Follow-Up Instructions: Return in about 5 months (around 04/01/2020) for Rheumatoid arthritis.   Ofilia Neas, PA-C  Note - This record has been created using Dragon software.  Chart creation errors have been sought, but may not always  have been located. Such creation errors do not reflect on  the standard of medical care.

## 2019-10-31 ENCOUNTER — Ambulatory Visit: Payer: BC Managed Care – PPO | Admitting: Physician Assistant

## 2019-10-31 ENCOUNTER — Encounter: Payer: Self-pay | Admitting: Physician Assistant

## 2019-10-31 ENCOUNTER — Other Ambulatory Visit: Payer: Self-pay

## 2019-10-31 VITALS — BP 99/55 | HR 83 | Resp 14 | Ht 61.0 in | Wt 122.0 lb

## 2019-10-31 DIAGNOSIS — E559 Vitamin D deficiency, unspecified: Secondary | ICD-10-CM | POA: Diagnosis not present

## 2019-10-31 DIAGNOSIS — F5101 Primary insomnia: Secondary | ICD-10-CM | POA: Diagnosis not present

## 2019-10-31 DIAGNOSIS — M0579 Rheumatoid arthritis with rheumatoid factor of multiple sites without organ or systems involvement: Secondary | ICD-10-CM | POA: Diagnosis not present

## 2019-10-31 DIAGNOSIS — Z79899 Other long term (current) drug therapy: Secondary | ICD-10-CM | POA: Diagnosis not present

## 2019-10-31 DIAGNOSIS — M722 Plantar fascial fibromatosis: Secondary | ICD-10-CM

## 2019-10-31 DIAGNOSIS — M8589 Other specified disorders of bone density and structure, multiple sites: Secondary | ICD-10-CM

## 2020-01-10 ENCOUNTER — Other Ambulatory Visit: Payer: Self-pay | Admitting: Rheumatology

## 2020-01-10 DIAGNOSIS — M0579 Rheumatoid arthritis with rheumatoid factor of multiple sites without organ or systems involvement: Secondary | ICD-10-CM

## 2020-01-10 NOTE — Telephone Encounter (Signed)
Last Visit: 10/31/2019 Next Visit: 02/26/2020 Labs: 09/17/2019 CBC and CMP are normal. Eye exam: 07/18/19 WNL  Current Dose per office note 10/31/2019 : Plaquenil 200 mg 1 tablet by mouth twice daily M-F  DX: Rheumatoid arthritis involving multiple sites with positive rheumatoid factor   Okay to refill per Dr. Corliss Skains

## 2020-02-14 NOTE — Progress Notes (Signed)
Office Visit Note  Patient: Carmen Christensen             Date of Birth: May 30, 1961           MRN: [id number]             PCP: Tracey Harries, MD Referring: Tracey Harries, MD Visit Date: 02/26/2020 Occupation: @GUAROCC @  Subjective:  Medication monitoring   History of Present Illness: Carmen Christensen is a 59 y.o. female with history of seropositive rheumatoid arthritis.  She states she had a few twinges in June when she had discomfort off and on in her hands.  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  After that she developed some swelling in her left wrist joint.  She states overall this year has been much better for her compared to last year prior to starting the Plaquenil.  None of the other joints are painful.  She has been more active than usual.  Activities of Daily Living:  Patient reports morning stiffness for 1-5 minutes.   Patient Denies nocturnal pain.  Difficulty dressing/grooming: Denies Difficulty climbing stairs: Denies Difficulty getting out of chair: Denies Difficulty using hands for taps, buttons, cutlery, and/or writing: Reports  Review of Systems  Constitutional: Positive for fatigue.  HENT: Negative for mouth sores, mouth dryness and nose dryness.   Eyes: Positive for dryness. Negative for visual disturbance.  Respiratory: Negative for shortness of breath and difficulty breathing.   Cardiovascular: Negative for chest pain, palpitations and swelling in legs/feet.  Gastrointestinal: Positive for diarrhea. Negative for abdominal pain, blood in stool and constipation.  Endocrine: Negative for increased urination.  Genitourinary: Negative for difficulty urinating and painful urination.  Musculoskeletal: Positive for arthralgias, joint pain, joint swelling and morning stiffness. Negative for myalgias, muscle weakness, muscle tenderness and myalgias.  Skin: Negative for color change, rash and redness.  Allergic/Immunologic: Negative  for susceptible to infections.  Neurological: Negative for dizziness, headaches and memory loss.  Hematological: Negative for bruising/bleeding tendency.  Psychiatric/Behavioral: Negative for confusion and sleep disturbance.    PMFS History:  Patient Active Problem List   Diagnosis Date Noted  . Rheumatoid arthritis involving multiple sites with positive rheumatoid factor (HCC) 02/26/2020  . High risk medication use 02/26/2020  . Primary insomnia 02/26/2020  . Plantar fasciitis, left 02/26/2020  . Osteopenia of multiple sites 02/26/2020    Past Medical History:  Diagnosis Date  . Rheumatoid arthritis (HCC)     Family History  Problem Relation Age of Onset  . Colon cancer Paternal Grandfather        not sure age of onset  . Cataracts Father   . Healthy Son   . Healthy Son    Past Surgical History:  Procedure Laterality Date  . DILATION AND CURETTAGE OF UTERUS  2000  . NASAL SINUS SURGERY  1993   Social History   Social History Narrative  . Not on file   Immunization History  Administered Date(s) Administered  . PFIZER SARS-COV-2 Vaccination 08/10/2019, 08/31/2019     Objective: Vital Signs: BP 125/76 (BP Location: Left Arm, Patient Position: Sitting, Cuff Size: Small)   Pulse 94   Resp 14   Ht 5\' 1"  (1.549 m)   Wt 120 lb 9.6 oz (54.7 kg)   LMP 02/26/2014   BMI 22.79 kg/m    Physical Exam Vitals and nursing note reviewed.  Constitutional:      Appearance: She is well-developed.  HENT:  Head: Normocephalic and atraumatic.  Eyes:     Conjunctiva/sclera: Conjunctivae normal.  Cardiovascular:     Rate and Rhythm: Normal rate and regular rhythm.     Heart sounds: Normal heart sounds.  Pulmonary:     Effort: Pulmonary effort is normal.     Breath sounds: Normal breath sounds.  Abdominal:     General: Bowel sounds are normal.     Palpations: Abdomen is soft.  Musculoskeletal:     Cervical back: Normal range of motion.  Lymphadenopathy:     Cervical:  No cervical adenopathy.  Skin:    General: Skin is warm and dry.     Capillary Refill: Capillary refill takes less than 2 seconds.  Neurological:     Mental Status: She is alert and oriented to person, place, and time.  Psychiatric:        Behavior: Behavior normal.      Musculoskeletal Exam: C-spine thoracic and lumbar spine with good range of motion. Shoulder joints, elbow joints, wrist joints, MCPs PIPs and DIPs with good range of motion with no synovitis. Hip joints, knee joints, ankles, MTPs and PIPs with good range of motion with no synovitis.  CDAI Exam: CDAI Score: 0.6  Patient Global: 3 mm; Provider Global: 3 mm Swollen: 0 ; Tender: 0  Joint Exam 02/26/2020   No joint exam has been documented for this visit   There is currently no information documented on the homunculus. Go to the Rheumatology activity and complete the homunculus joint exam.  Investigation: No additional findings.  Imaging: No results found.  Recent Labs: Lab Results  Component Value Date   WBC 4.5 09/17/2019   HGB 12.1 09/17/2019   PLT 228 09/17/2019   NA 139 09/17/2019   K 4.1 09/17/2019   CL 103 09/17/2019   CO2 29 09/17/2019   GLUCOSE 87 09/17/2019   BUN 21 09/17/2019   CREATININE 0.72 09/17/2019   BILITOT 0.4 09/17/2019   AST 16 09/17/2019   ALT 13 09/17/2019   PROT 6.4 09/17/2019   CALCIUM 9.4 09/17/2019   GFRAA 108 09/17/2019   QFTBGOLDPLUS NEGATIVE 05/02/2019    Speciality Comments: PLQ Eye Exam: 01/14/2020 @ [facility] follow up in 6 months  Procedures:  No procedures performed Allergies: Patient has no known allergies.   Assessment / Plan:     Visit Diagnoses: Rheumatoid arthritis involving multiple sites with positive rheumatoid factor (HCC) - 04/22/19: ANA-, sed rate 75, RF 62.5.  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 She had no synovitis on examination today. We discussed possible use of methotrexate in the future. Indications side effects contraindications were discussed at length. She wants to hold off therapy at this point.  High risk medication use - Plaquenil 200 mg 1 tablet by mouth twice daily M-F (started at end of November 2020). PLQ Eye Exam: 01/14/2020 - Plan: CBC with Differential/Platelet, COMPLETE METABOLIC PANEL WITH GFR today and then every 5 months.  Primary insomnia - trazodone 50 mg 1 tablet by mouth at bedtime for insomnia. It has been working well for her.  Plantar fasciitis, left-she has changed shoes with arch support which has been helpful. Stretching exercises were emphasized.  Vitamin D deficiency  Osteopenia of multiple sites - DEXA 07/19/19: Left distal radius: T-score -2.2.  Use of calcium, vitamin D  and resistive exercise was discussed.  Educated about COVID-19 virus infection-she has had both vaccines for COVID-19. Use of booster was discussed. Instructions were placed in the AVS. Use of mask, social distancing and hand hygiene was discussed. I also discussed that she may be a candidate for monoclonal antibodies in case she develops COVID-19 infection.    Orders: Orders Placed This Encounter  Procedures  . CBC with Differential/Platelet  . COMPLETE METABOLIC PANEL WITH GFR   No orders of the defined types were placed in this encounter.   Follow-Up Instructions: Return in about 5 months (around 07/28/2020) for Rheumatoid arthritis.   Pollyann Savoy, MD  Note - This record has been created using Animal nutritionist.  Chart creation errors have been sought, but may not always  have been located. Such creation errors do not reflect on  the standard of medical care.

## 2020-02-26 ENCOUNTER — Other Ambulatory Visit: Payer: Self-pay

## 2020-02-26 ENCOUNTER — Encounter: Payer: Self-pay | Admitting: Rheumatology

## 2020-02-26 ENCOUNTER — Ambulatory Visit: Payer: BC Managed Care – PPO | Admitting: Rheumatology

## 2020-02-26 VITALS — BP 125/76 | HR 94 | Resp 14 | Ht 61.0 in | Wt 120.6 lb

## 2020-02-26 DIAGNOSIS — M0579 Rheumatoid arthritis with rheumatoid factor of multiple sites without organ or systems involvement: Secondary | ICD-10-CM | POA: Diagnosis not present

## 2020-02-26 DIAGNOSIS — F5101 Primary insomnia: Secondary | ICD-10-CM

## 2020-02-26 DIAGNOSIS — M722 Plantar fascial fibromatosis: Secondary | ICD-10-CM | POA: Insufficient documentation

## 2020-02-26 DIAGNOSIS — M8589 Other specified disorders of bone density and structure, multiple sites: Secondary | ICD-10-CM

## 2020-02-26 DIAGNOSIS — Z79899 Other long term (current) drug therapy: Secondary | ICD-10-CM | POA: Diagnosis not present

## 2020-02-26 DIAGNOSIS — Z7189 Other specified counseling: Secondary | ICD-10-CM

## 2020-02-26 DIAGNOSIS — E559 Vitamin D deficiency, unspecified: Secondary | ICD-10-CM

## 2020-02-26 NOTE — Patient Instructions (Signed)

## 2020-02-27 LAB — CBC WITH DIFFERENTIAL/PLATELET
Absolute Monocytes: 413 cells/uL (ref 200–950)
Basophils Absolute: 28 cells/uL (ref 0–200)
Basophils Relative: 0.5 %
Eosinophils Absolute: 50 cells/uL (ref 15–500)
Eosinophils Relative: 0.9 %
HCT: 38.1 % (ref 35.0–45.0)
Hemoglobin: 12.7 g/dL (ref 11.7–15.5)
Lymphs Abs: 1694 cells/uL (ref 850–3900)
MCH: 31.3 pg (ref 27.0–33.0)
MCHC: 33.3 g/dL (ref 32.0–36.0)
MCV: 93.8 fL (ref 80.0–100.0)
MPV: 10.2 fL (ref 7.5–12.5)
Monocytes Relative: 7.5 %
Neutro Abs: 3317 cells/uL (ref 1500–7800)
Neutrophils Relative %: 60.3 %
Platelets: 225 10*3/uL (ref 140–400)
RBC: 4.06 10*6/uL (ref 3.80–5.10)
RDW: 12 % (ref 11.0–15.0)
Total Lymphocyte: 30.8 %
WBC: 5.5 10*3/uL (ref 3.8–10.8)

## 2020-02-27 LAB — COMPLETE METABOLIC PANEL WITH GFR
AG Ratio: 1.8 (calc) (ref 1.0–2.5)
ALT: 13 U/L (ref 6–29)
AST: 18 U/L (ref 10–35)
Albumin: 4.4 g/dL (ref 3.6–5.1)
Alkaline phosphatase (APISO): 57 U/L (ref 37–153)
BUN: 17 mg/dL (ref 7–25)
CO2: 30 mmol/L (ref 20–32)
Calcium: 9.8 mg/dL (ref 8.6–10.4)
Chloride: 103 mmol/L (ref 98–110)
Creat: 0.77 mg/dL (ref 0.50–1.05)
GFR, Est African American: 99 mL/min/{1.73_m2} (ref >=60)
GFR, Est Non African American: 86 mL/min/{1.73_m2} (ref >=60)
Globulin: 2.4 g/dL (calc) (ref 1.9–3.7)
Glucose, Bld: 77 mg/dL (ref 65–99)
Potassium: 4.1 mmol/L (ref 3.5–5.3)
Sodium: 141 mmol/L (ref 135–146)
Total Bilirubin: 0.3 mg/dL (ref 0.2–1.2)
Total Protein: 6.8 g/dL (ref 6.1–8.1)

## 2020-02-27 NOTE — Progress Notes (Signed)
CBC and CMP are normal.

## 2020-04-01 ENCOUNTER — Other Ambulatory Visit: Payer: Self-pay | Admitting: Rheumatology

## 2020-04-01 DIAGNOSIS — M0579 Rheumatoid arthritis with rheumatoid factor of multiple sites without organ or systems involvement: Secondary | ICD-10-CM

## 2020-04-01 NOTE — Telephone Encounter (Signed)
Last Visit: 02/26/2020 Next Visit: 07/29/2020 Labs: 02/26/2020 CBC and CMP are normal. PLQ Eye Exam:  01/14/2020  Current Dose per office note 02/26/2020: Plaquenil 200 mg 1 tablet by mouth twice daily M-F DX: Rheumatoid arthritis involving multiple sites with positive rheumatoid factor   Okay to refill per Dr. Corliss Skains

## 2020-04-10 ENCOUNTER — Telehealth: Payer: Self-pay

## 2020-04-10 NOTE — Telephone Encounter (Signed)
Patient advised Sherron Ales, PA-C would recommend the third dose of the covid-19 vaccine.

## 2020-04-10 NOTE — Telephone Encounter (Signed)
Patient left a voicemail stating she is scheduled for her Covid booster this afternoon at 4:30 pm.  Patient is requesting a return call to let her know if she should take the Covid booster or the extra dose.

## 2020-04-10 NOTE — Telephone Encounter (Signed)
I would recommend the third dose of the covid-19 vaccine.

## 2020-06-19 ENCOUNTER — Other Ambulatory Visit: Payer: Self-pay | Admitting: Rheumatology

## 2020-06-19 DIAGNOSIS — M0579 Rheumatoid arthritis with rheumatoid factor of multiple sites without organ or systems involvement: Secondary | ICD-10-CM

## 2020-06-19 NOTE — Telephone Encounter (Signed)
Last Visit: 02/26/2020 Next Visit: 07/29/2020 Labs: 02/26/2020, CBC and CMP are normal. Eye exam: 01/14/2020 Normal  Current Dose per office note 02/26/2020: Plaquenil 200 mg 1 tablet by mouth twice daily M-F (started at end of November 2020). DX: Rheumatoid arthritis involving multiple sites with positive rheumatoid factor   Okay to refill Plaquenil?

## 2020-07-16 NOTE — Progress Notes (Signed)
Office Visit Note  Patient: Carmen Christensen             Date of Birth: 12-02-1960           MRN: [id number]             PCP: Tracey Harries, MD Referring: Tracey Harries, MD Visit Date: 07/29/2020 Occupation: @GUAROCC @  Subjective:  Medication management.   History of Present Illness: Carmen Christensen is a 60 y.o. female with history of seropositive rheumatoid arthritis.  She has been on Plaquenil since November 2020.  She states that she has not had a flare in the last 5 months.  She states last week she developed sudden pain and swelling in her left wrist joint which lasted for about 4 days.  She used a brace and it subsided by itself.  She has been tolerating Plaquenil well and has noticed great improvement since she has been taking the medication.  Activities of Daily Living:  Patient reports morning stiffness for 0 minutes.   Patient Denies nocturnal pain.  Difficulty dressing/grooming: Denies Difficulty climbing stairs: Denies Difficulty getting out of chair: Denies Difficulty using hands for taps, buttons, cutlery, and/or writing: Reports  Review of Systems  Constitutional: Positive for fatigue.  HENT: Negative for mouth sores, mouth dryness and nose dryness.   Eyes: Positive for dryness. Negative for pain and itching.  Respiratory: Negative for shortness of breath and difficulty breathing.   Cardiovascular: Positive for palpitations. Negative for chest pain.  Gastrointestinal: Negative for blood in stool, constipation and diarrhea.  Endocrine: Negative for increased urination.  Genitourinary: Negative for difficulty urinating.  Musculoskeletal: Negative for arthralgias, joint pain, joint swelling, myalgias, morning stiffness, muscle tenderness and myalgias.  Skin: Positive for rash. Negative for color change and redness.       Hives occasionally on thighs   Allergic/Immunologic: Negative for susceptible to infections.  Neurological: Negative for dizziness, numbness,  headaches, memory loss and weakness.  Hematological: Negative for bruising/bleeding tendency.  Psychiatric/Behavioral: Negative for confusion and sleep disturbance.    PMFS History:  Patient Active Problem List   Diagnosis Date Noted  . Rheumatoid arthritis involving multiple sites with positive rheumatoid factor (HCC) 02/26/2020  . High risk medication use 02/26/2020  . Primary insomnia 02/26/2020  . Plantar fasciitis, left 02/26/2020  . Osteopenia of multiple sites 02/26/2020    Past Medical History:  Diagnosis Date  . Rheumatoid arthritis (HCC)     Family History  Problem Relation Age of Onset  . Colon cancer Paternal Grandfather        not sure age of onset  . Cataracts Father   . Healthy Son   . Healthy Son    Past Surgical History:  Procedure Laterality Date  . DILATION AND CURETTAGE OF UTERUS  2000  . NASAL SINUS SURGERY  1993   Social History   Social History Narrative  . Not on file   Immunization History  Administered Date(s) Administered  . PFIZER(Purple Top)SARS-COV-2 Vaccination 08/10/2019, 08/31/2019, 04/10/2020     Objective: Vital Signs: BP 121/70 (BP Location: Left Arm, Patient Position: Sitting, Cuff Size: Normal)   Pulse 85   Resp 13   Ht 5\' 1"  (1.549 m)   Wt 120 lb 12.8 oz (54.8 kg)   LMP 02/26/2014   BMI 22.82 kg/m    Physical Exam Vitals and nursing note reviewed.  Constitutional:      Appearance: She is well-developed and well-nourished.  HENT:     Head: Normocephalic and  atraumatic.  Eyes:     Extraocular Movements: EOM normal.     Conjunctiva/sclera: Conjunctivae normal.  Cardiovascular:     Rate and Rhythm: Normal rate and regular rhythm.     Pulses: Intact distal pulses.     Heart sounds: Normal heart sounds.  Pulmonary:     Effort: Pulmonary effort is normal.     Breath sounds: Normal breath sounds.  Abdominal:     General: Bowel sounds are normal.     Palpations: Abdomen is soft.  Musculoskeletal:     Cervical back:  Normal range of motion.  Lymphadenopathy:     Cervical: No cervical adenopathy.  Skin:    General: Skin is warm and dry.     Capillary Refill: Capillary refill takes less than 2 seconds.  Neurological:     Mental Status: She is alert and oriented to person, place, and time.  Psychiatric:        Mood and Affect: Mood and affect normal.        Behavior: Behavior normal.      Musculoskeletal Exam: C-spine was in good range of motion.  Shoulder joints, elbow joints, wrist joints, MCPs PIPs and DIPs with good range of motion without synovitis.  Hip joints, knee joints, ankles, MTPs and PIPs with good range of motion with no synovitis.  CDAI Exam: CDAI Score: 0.4  Patient Global: 2 mm; Provider Global: 2 mm Swollen: 0 ; Tender: 0  Joint Exam 07/29/2020   No joint exam has been documented for this visit     Investigation: No additional findings.  Imaging: No results found.  Recent Labs: Lab Results  Component Value Date   WBC 5.5 02/26/2020   HGB 12.7 02/26/2020   PLT 225 02/26/2020   NA 141 02/26/2020   K 4.1 02/26/2020   CL 103 02/26/2020   CO2 30 02/26/2020   GLUCOSE 77 02/26/2020   BUN 17 02/26/2020   CREATININE 0.77 02/26/2020   BILITOT 0.3 02/26/2020   AST 18 02/26/2020   ALT 13 02/26/2020   PROT 6.8 02/26/2020   CALCIUM 9.8 02/26/2020   GFRAA 99 02/26/2020   QFTBGOLDPLUS NEGATIVE 05/02/2019    Speciality Comments: PLQ Eye Exam: 01/14/2020 Normal @ [facility] follow up in 6 months PLQ started in November 2020  Procedures:  No procedures performed Allergies: Patient has no known allergies.   Assessment / Plan:     Visit Diagnoses: Rheumatoid arthritis involving multiple sites with positive rheumatoid factor (HCC) - 04/22/19: ANA-, sed rate 75, RF 62.5.  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  Although she had a flare last week in her left wrist joint which  lasted for 4 days and then resolved by itself.  She has noticed great improvement in the number of flares since she has been on Plaquenil.  She had no synovitis or tenderness on my examination today.  High risk medication use - Plaquenil 200 mg 1 tablet by mouth twice daily M-F (started at end of November 2020). PLQ Eye Exam: 01/14/2020 - Plan: CBC with Differential/Platelet, COMPLETE METABOLIC PANEL WITH GFR today and then every 5 months.  Primary insomnia - trazodone 50 mg 1 tablet by mouth at bedtime for insomnia.  Vitamin D deficiency-she is not taking vitamin D supplement currently.  Have advised her to take the vitamin D supplement.  Osteopenia of multiple sites - DEXA 07/19/19: Left distal radius: T-score -  2.2.  Use of calcium, vitamin D and resistive exercises were discussed.  Orders: Orders Placed This Encounter  Procedures  . CBC with Differential/Platelet  . COMPLETE METABOLIC PANEL WITH GFR   No orders of the defined types were placed in this encounter.    Follow-Up Instructions: Return in about 5 months (around 12/26/2020) for Rheumatoid arthritis.   Pollyann Savoy, MD  Note - This record has been created using Animal nutritionist.  Chart creation errors have been sought, but may not always  have been located. Such creation errors do not reflect on  the standard of medical care.

## 2020-07-29 ENCOUNTER — Other Ambulatory Visit: Payer: Self-pay

## 2020-07-29 ENCOUNTER — Encounter: Payer: Self-pay | Admitting: Rheumatology

## 2020-07-29 ENCOUNTER — Ambulatory Visit: Payer: BC Managed Care – PPO | Admitting: Rheumatology

## 2020-07-29 VITALS — BP 121/70 | HR 85 | Resp 13 | Ht 61.0 in | Wt 120.8 lb

## 2020-07-29 DIAGNOSIS — M0579 Rheumatoid arthritis with rheumatoid factor of multiple sites without organ or systems involvement: Secondary | ICD-10-CM | POA: Diagnosis not present

## 2020-07-29 DIAGNOSIS — Z79899 Other long term (current) drug therapy: Secondary | ICD-10-CM

## 2020-07-29 DIAGNOSIS — M722 Plantar fascial fibromatosis: Secondary | ICD-10-CM

## 2020-07-29 DIAGNOSIS — F5101 Primary insomnia: Secondary | ICD-10-CM | POA: Diagnosis not present

## 2020-07-29 DIAGNOSIS — E559 Vitamin D deficiency, unspecified: Secondary | ICD-10-CM | POA: Diagnosis not present

## 2020-07-29 DIAGNOSIS — M8589 Other specified disorders of bone density and structure, multiple sites: Secondary | ICD-10-CM

## 2020-07-30 LAB — CBC WITH DIFFERENTIAL/PLATELET
Absolute Monocytes: 451 cells/uL (ref 200–950)
Basophils Absolute: 29 cells/uL (ref 0–200)
Basophils Relative: 0.6 %
Eosinophils Absolute: 113 cells/uL (ref 15–500)
Eosinophils Relative: 2.3 %
HCT: 36.8 % (ref 35.0–45.0)
Hemoglobin: 12.5 g/dL (ref 11.7–15.5)
Lymphs Abs: 1274 cells/uL (ref 850–3900)
MCH: 31.7 pg (ref 27.0–33.0)
MCHC: 34 g/dL (ref 32.0–36.0)
MCV: 93.4 fL (ref 80.0–100.0)
MPV: 10.3 fL (ref 7.5–12.5)
Monocytes Relative: 9.2 %
Neutro Abs: 3033 cells/uL (ref 1500–7800)
Neutrophils Relative %: 61.9 %
Platelets: 230 10*3/uL (ref 140–400)
RBC: 3.94 10*6/uL (ref 3.80–5.10)
RDW: 11.8 % (ref 11.0–15.0)
Total Lymphocyte: 26 %
WBC: 4.9 10*3/uL (ref 3.8–10.8)

## 2020-07-30 LAB — COMPLETE METABOLIC PANEL WITH GFR
AG Ratio: 1.8 (calc) (ref 1.0–2.5)
ALT: 14 U/L (ref 6–29)
AST: 19 U/L (ref 10–35)
Albumin: 4.2 g/dL (ref 3.6–5.1)
Alkaline phosphatase (APISO): 57 U/L (ref 37–153)
BUN: 25 mg/dL (ref 7–25)
CO2: 30 mmol/L (ref 20–32)
Calcium: 9.5 mg/dL (ref 8.6–10.4)
Chloride: 102 mmol/L (ref 98–110)
Creat: 0.65 mg/dL (ref 0.50–1.05)
GFR, Est African American: 114 mL/min/{1.73_m2} (ref >=60)
GFR, Est Non African American: 99 mL/min/{1.73_m2} (ref >=60)
Globulin: 2.3 g/dL (calc) (ref 1.9–3.7)
Glucose, Bld: 90 mg/dL (ref 65–99)
Potassium: 4.5 mmol/L (ref 3.5–5.3)
Sodium: 140 mmol/L (ref 135–146)
Total Bilirubin: 0.3 mg/dL (ref 0.2–1.2)
Total Protein: 6.5 g/dL (ref 6.1–8.1)

## 2020-07-30 NOTE — Progress Notes (Signed)
CBC and CMP are normal.

## 2020-09-12 ENCOUNTER — Other Ambulatory Visit: Payer: Self-pay | Admitting: Physician Assistant

## 2020-09-12 DIAGNOSIS — M0579 Rheumatoid arthritis with rheumatoid factor of multiple sites without organ or systems involvement: Secondary | ICD-10-CM

## 2020-09-14 NOTE — Telephone Encounter (Signed)
Last Visit: 07/29/2020  Next Visit: 12/29/2020 Labs: 07/29/2020, CBC and CMP are normal. Eye exam: 01/14/2020 6 months  LMOM PLQ eye exam due.  Current Dose per office note 07/29/2020, Plaquenil 200 mg 1 tablet by mouth twice daily M-F  DX:  Rheumatoid arthritis involving multiple sites with positive rheumatoid factor   Last Fill: 06/19/2020  Okay to refill Plaquenil?

## 2020-12-15 ENCOUNTER — Other Ambulatory Visit: Payer: Self-pay | Admitting: Physician Assistant

## 2020-12-15 DIAGNOSIS — M0579 Rheumatoid arthritis with rheumatoid factor of multiple sites without organ or systems involvement: Secondary | ICD-10-CM

## 2020-12-15 NOTE — Progress Notes (Signed)
Office Visit Note  Patient: Carmen Christensen             Date of Birth: 1961-05-29           MRN: [id number]             PCP: Tracey Harries, MD Referring: Tracey Harries, MD Visit Date: 12/29/2020 Occupation: @GUAROCC @  Subjective:  Joint to stiffness   History of Present Illness: Carmen Christensen is a 60 y.o. female with history of seropositive rheumatoid arthritis.  She has not had any major flares since she started Plaquenil.  She states recently she has been noticing some puffiness in her hands and also stiffness in her joints.  Activities of Daily Living:  Patient reports morning stiffness for 1 minute.   Patient Denies nocturnal pain.  Difficulty dressing/grooming: Denies Difficulty climbing stairs: Denies Difficulty getting out of chair: Denies Difficulty using hands for taps, buttons, cutlery, and/or writing: Reports  Review of Systems  Constitutional:  Positive for fatigue. Negative for night sweats, weight gain and weight loss.  HENT:  Negative for mouth sores, trouble swallowing, trouble swallowing, mouth dryness and nose dryness.   Eyes:  Positive for dryness. Negative for pain, redness and visual disturbance.  Respiratory:  Negative for cough, shortness of breath and difficulty breathing.   Cardiovascular:  Negative for chest pain, palpitations, hypertension, irregular heartbeat and swelling in legs/feet.  Gastrointestinal:  Negative for blood in stool, constipation and diarrhea.  Endocrine: Negative for increased urination.  Genitourinary:  Negative for vaginal dryness.  Musculoskeletal:  Positive for joint pain, joint pain and morning stiffness. Negative for joint swelling, myalgias, muscle weakness, muscle tenderness and myalgias.  Skin:  Negative for color change, rash, hair loss, skin tightness, ulcers and sensitivity to sunlight.  Allergic/Immunologic: Negative for susceptible to infections.  Neurological:  Negative for dizziness, memory loss, night sweats and  weakness.  Hematological:  Negative for swollen glands.  Psychiatric/Behavioral:  Negative for depressed mood and sleep disturbance. The patient is not nervous/anxious.    PMFS History:  Patient Active Problem List   Diagnosis Date Noted   Rheumatoid arthritis involving multiple sites with positive rheumatoid factor (HCC) 02/26/2020   High risk medication use 02/26/2020   Primary insomnia 02/26/2020   Plantar fasciitis, left 02/26/2020   Osteopenia of multiple sites 02/26/2020    Past Medical History:  Diagnosis Date   Rheumatoid arthritis (HCC)     Family History  Problem Relation Age of Onset   Colon cancer Paternal Grandfather        not sure age of onset   Cataracts Father    Healthy Son    Healthy Son    Past Surgical History:  Procedure Laterality Date   DILATION AND CURETTAGE OF UTERUS  2000   NASAL SINUS SURGERY  1993   Social History   Social History Narrative   Not on file   Immunization History  Administered Date(s) Administered   PFIZER(Purple Top)SARS-COV-2 Vaccination 08/10/2019, 08/31/2019, 04/10/2020     Objective: Vital Signs: BP 113/60 (BP Location: Left Arm, Patient Position: Sitting, Cuff Size: Small)   Pulse 80   Resp 12   Ht 5\' 1"  (1.549 m)   Wt 114 lb 12.8 oz (52.1 kg)   LMP 02/26/2014   BMI 21.69 kg/m    Physical Exam Vitals and nursing note reviewed.  Constitutional:      Appearance: She is well-developed.  HENT:     Head: Normocephalic and atraumatic.  Eyes:  Conjunctiva/sclera: Conjunctivae normal.  Cardiovascular:     Rate and Rhythm: Normal rate and regular rhythm.     Heart sounds: Normal heart sounds.  Pulmonary:     Effort: Pulmonary effort is normal.     Breath sounds: Normal breath sounds.  Abdominal:     General: Bowel sounds are normal.     Palpations: Abdomen is soft.  Musculoskeletal:     Cervical back: Normal range of motion.  Lymphadenopathy:     Cervical: No cervical adenopathy.  Skin:    General: Skin  is warm and dry.     Capillary Refill: Capillary refill takes less than 2 seconds.  Neurological:     Mental Status: She is alert and oriented to person, place, and time.  Psychiatric:        Behavior: Behavior normal.     Musculoskeletal Exam: C-spine was in good range of motion.  Shoulder joints, elbow joints, wrist joints, MCPs PIPs and DIPs with good range of motion with no synovitis.  She had bilateral PIP and DIP thickening.  Hip joints, knee joints, ankles, MTPs and PIPs with good range of motion with no synovitis.  CDAI Exam: CDAI Score: 0.2  Patient Global: 1 mm; Provider Global: 1 mm Swollen: 0 ; Tender: 0  Joint Exam 12/29/2020   No joint exam has been documented for this visit   There is currently no information documented on the homunculus. Go to the Rheumatology activity and complete the homunculus joint exam.  Investigation: No additional findings.  Imaging: No results found.  Recent Labs: Lab Results  Component Value Date   WBC 4.9 07/29/2020   HGB 12.5 07/29/2020   PLT 230 07/29/2020   NA 140 07/29/2020   K 4.5 07/29/2020   CL 102 07/29/2020   CO2 30 07/29/2020   GLUCOSE 90 07/29/2020   BUN 25 07/29/2020   CREATININE 0.65 07/29/2020   BILITOT 0.3 07/29/2020   AST 19 07/29/2020   ALT 14 07/29/2020   PROT 6.5 07/29/2020   CALCIUM 9.5 07/29/2020   GFRAA 114 07/29/2020   QFTBGOLDPLUS NEGATIVE 05/02/2019    Speciality Comments: PLQ Eye Exam: 01/14/2020 Normal @ [facility] follow up in 6 months PLQ started in November 2020  Scheduled for PLQ eye exam 01/11/2021, Cephus Richer, Jr. OD received 09/23/2020  Procedures:  No procedures performed Allergies: Patient has no known allergies.   Assessment / Plan:     Visit Diagnoses: Rheumatoid arthritis involving multiple sites with positive rheumatoid factor (HCC) - 04/22/19: ANA-, sed rate 75, RF 62.5.   Patient had no synovitis on my examination today.  She states recently she has been  experiencing some discomfort in her hands.  She also has a stiffness in her shoulders.  I will check sed rate today.  I will also obtain a screening radiographs.- Plan: Sedimentation rate, XR Hand 2 View Right, XR Hand 2 View Left, XR Foot 2 Views Right, XR Foot 2 Views Left.  X-ray of bilateral hands and bilateral feet showed osteoarthritic changes.  Patient was notified regarding x-ray results.  High risk medication use - Plaquenil 200 mg 1 tablet by mouth twice daily M-F (started at end of November 2020). PLQ Eye Exam: 01/14/2020 -she is supposed to get repeat eye examination.  Plan: CBC with Differential/Platelet, COMPLETE METABOLIC PANEL WITH GFR, Hydroxychloroquine, Blood.  Information regarding immunization was placed in the AVS.  Primary insomnia-improved.  Vitamin D deficiency -she had vitamin D deficiency in the past.  She has  been experiencing increased fatigue.  We will check vitamin D level today.  Plan: VITAMIN D 25 Hydroxy (Vit-D Deficiency, Fractures)  Osteopenia of multiple sites - DEXA 07/19/19: Left distal radius: T-score -2.2.  Patient will need repeat DEXA scan in 2023.  Use of calcium, vitamin D and resistive exercises was discussed.  Orders: Orders Placed This Encounter  Procedures   XR Hand 2 View Right   XR Hand 2 View Left   XR Foot 2 Views Right   XR Foot 2 Views Left   CBC with Differential/Platelet   COMPLETE METABOLIC PANEL WITH GFR   Hydroxychloroquine, Blood   Sedimentation rate   VITAMIN D 25 Hydroxy (Vit-D Deficiency, Fractures)    No orders of the defined types were placed in this encounter.    Follow-Up Instructions: Return for Rheumatoid arthritis.   Pollyann Savoy, MD  Note - This record has been created using Animal nutritionist.  Chart creation errors have been sought, but may not always  have been located. Such creation errors do not reflect on  the standard of medical care.

## 2020-12-15 NOTE — Telephone Encounter (Signed)
Last Visit: 07/29/2020 Next Visit: 12/29/2020 Labs: 07/29/2020, CBC and CMP are normal. Eye exam: 01/14/2020  f/u 6 months, scheduled 01/11/2021  Current Dose per office note 07/29/2020: Plaquenil 200 mg 1 tablet by mouth twice daily M-F  [id number] arthritis involving multiple sites with positive rheumatoid factor  Last Fill: 09/14/2020  Okay to refill Plaquenil?

## 2020-12-29 ENCOUNTER — Ambulatory Visit: Payer: BC Managed Care – PPO | Admitting: Rheumatology

## 2020-12-29 ENCOUNTER — Ambulatory Visit: Payer: Self-pay

## 2020-12-29 ENCOUNTER — Other Ambulatory Visit: Payer: Self-pay

## 2020-12-29 ENCOUNTER — Encounter: Payer: Self-pay | Admitting: Rheumatology

## 2020-12-29 VITALS — BP 113/60 | HR 80 | Resp 12 | Ht 61.0 in | Wt 114.8 lb

## 2020-12-29 DIAGNOSIS — M0579 Rheumatoid arthritis with rheumatoid factor of multiple sites without organ or systems involvement: Secondary | ICD-10-CM

## 2020-12-29 DIAGNOSIS — M79641 Pain in right hand: Secondary | ICD-10-CM | POA: Diagnosis not present

## 2020-12-29 DIAGNOSIS — F5101 Primary insomnia: Secondary | ICD-10-CM

## 2020-12-29 DIAGNOSIS — M79642 Pain in left hand: Secondary | ICD-10-CM | POA: Diagnosis not present

## 2020-12-29 DIAGNOSIS — E559 Vitamin D deficiency, unspecified: Secondary | ICD-10-CM

## 2020-12-29 DIAGNOSIS — Z79899 Other long term (current) drug therapy: Secondary | ICD-10-CM | POA: Diagnosis not present

## 2020-12-29 DIAGNOSIS — M8589 Other specified disorders of bone density and structure, multiple sites: Secondary | ICD-10-CM

## 2020-12-29 DIAGNOSIS — M79672 Pain in left foot: Secondary | ICD-10-CM | POA: Diagnosis not present

## 2020-12-29 DIAGNOSIS — M79671 Pain in right foot: Secondary | ICD-10-CM

## 2020-12-29 NOTE — Patient Instructions (Signed)
Vaccines You are taking a medication(s) that can suppress your immune system.  The following immunizations are recommended: Flu annually Covid-19  Td/Tdap (tetanus, diphtheria, pertussis) every 10 years Pneumonia (Prevnar 15 then Pneumovax 23 at least 1 year apart.  Alternatively, can take Prevnar 20 without needing additional dose) Shingrix (after age 58): 2 doses from 4 weeks to 6 months apart  Please check with your PCP to make sure you are up to date.   If you have signs or symptoms of an infection or start antibiotics: First, call your PCP for workup of your infection. Hold your medication through the infection, until you complete your antibiotics, and until symptoms resolve if you take the following: Injectable medication (Actemra, Benlysta, Cimzia, Cosentyx, Enbrel, Humira, Kevzara, Orencia, Remicade, Simponi, Stelara, Taltz, Tremfya) Methotrexate Leflunomide (Arava) Mycophenolate (Cellcept) Harriette Ohara, Olumiant, or Rinvoq  Heart Disease Prevention   Your inflammatory disease increases your risk of heart disease which includes heart attack, stroke, atrial fibrillation (irregular heartbeats), high blood pressure, heart failure and atherosclerosis (plaque in the arteries).  It is important to reduce your risk by:   Keep blood pressure, cholesterol, and blood sugar at healthy levels   Smoking Cessation   Maintain a healthy weight  BMI 20-25   Eat a healthy diet  Plenty of fresh fruit, vegetables, and whole grains  Limit saturated fats, foods high in sodium, and added sugars  DASH and Mediterranean diet   Increase physical activity  Recommend moderate physically activity for 150 minutes per week/ 30 minutes a day for five days a week These can be broken up into three separate ten-minute sessions during the day.   Reduce Stress  Meditation, slow breathing exercises, yoga, coloring books  Dental visits twice a year

## 2021-01-06 LAB — COMPLETE METABOLIC PANEL WITH GFR
AG Ratio: 1.9 (calc) (ref 1.0–2.5)
ALT: 20 U/L (ref 6–29)
AST: 20 U/L (ref 10–35)
Albumin: 4.5 g/dL (ref 3.6–5.1)
Alkaline phosphatase (APISO): 55 U/L (ref 37–153)
BUN: 24 mg/dL (ref 7–25)
CO2: 30 mmol/L (ref 20–32)
Calcium: 9.5 mg/dL (ref 8.6–10.4)
Chloride: 100 mmol/L (ref 98–110)
Creat: 0.72 mg/dL (ref 0.50–1.03)
Globulin: 2.4 g/dL (calc) (ref 1.9–3.7)
Glucose, Bld: 79 mg/dL (ref 65–99)
Potassium: 4.2 mmol/L (ref 3.5–5.3)
Sodium: 137 mmol/L (ref 135–146)
Total Bilirubin: 0.5 mg/dL (ref 0.2–1.2)
Total Protein: 6.9 g/dL (ref 6.1–8.1)
eGFR: 97 mL/min/{1.73_m2} (ref >=60)

## 2021-01-06 LAB — CBC WITH DIFFERENTIAL/PLATELET
Absolute Monocytes: 303 cells/uL (ref 200–950)
Basophils Absolute: 29 cells/uL (ref 0–200)
Basophils Relative: 0.7 %
Eosinophils Absolute: 41 cells/uL (ref 15–500)
Eosinophils Relative: 1 %
HCT: 39.7 % (ref 35.0–45.0)
Hemoglobin: 13.5 g/dL (ref 11.7–15.5)
Lymphs Abs: 1361 cells/uL (ref 850–3900)
MCH: 31.5 pg (ref 27.0–33.0)
MCHC: 34 g/dL (ref 32.0–36.0)
MCV: 92.8 fL (ref 80.0–100.0)
MPV: 10 fL (ref 7.5–12.5)
Monocytes Relative: 7.4 %
Neutro Abs: 2366 cells/uL (ref 1500–7800)
Neutrophils Relative %: 57.7 %
Platelets: 228 10*3/uL (ref 140–400)
RBC: 4.28 10*6/uL (ref 3.80–5.10)
RDW: 12 % (ref 11.0–15.0)
Total Lymphocyte: 33.2 %
WBC: 4.1 10*3/uL (ref 3.8–10.8)

## 2021-01-06 LAB — VITAMIN D 25 HYDROXY (VIT D DEFICIENCY, FRACTURES): Vit D, 25-Hydroxy: 40 ng/mL (ref 30–100)

## 2021-01-06 LAB — HYDROXYCHLOROQUINE,BLOOD: HYDROXYCHLOROQUINE, (B): 530 ng/mL — ABNORMAL HIGH

## 2021-01-06 LAB — SEDIMENTATION RATE: Sed Rate: 9 mm/h (ref 0–30)

## 2021-01-06 NOTE — Progress Notes (Signed)
Hydroxychloroquine level is therapeutic and not high.  It is reported high 40% was not taking hydroxychloroquine.  She should see her PCP for evaluation of weight loss.

## 2021-01-06 NOTE — Progress Notes (Signed)
Plaquenil blood level is in desirable range

## 2021-03-13 ENCOUNTER — Other Ambulatory Visit: Payer: Self-pay | Admitting: Rheumatology

## 2021-03-13 DIAGNOSIS — M0579 Rheumatoid arthritis with rheumatoid factor of multiple sites without organ or systems involvement: Secondary | ICD-10-CM

## 2021-03-15 NOTE — Telephone Encounter (Signed)
Next Visit: 06/02/2021  Last Visit: 12/29/2020  Labs: 12/29/2020 CBC and CMP WNL. Plaquenil blood level is in desirable range  Eye exam: 01/11/2021 Normal   Current Dose per office note 12/29/2020: Plaquenil 200 mg 1 tablet by mouth twice daily M-F   [id number] arthritis involving multiple sites with positive rheumatoid factor   Last Fill: 12/15/2020  Okay to refill Plaquenil?

## 2021-05-20 NOTE — Progress Notes (Signed)
Office Visit Note  Patient: Carmen Christensen             Date of Birth: [birth year]           MRN: [id number]             PCP: Bernerd Limbo, MD Referring: Bernerd Limbo, MD Visit Date: 06/02/2021 Occupation: @GUAROCC @  Subjective:  Medication management   History of Present Illness: Carmen Christensen is a 60 y.o. female with a history of rheumatoid arthritis.  She states she has been doing well on hydroxychloroquine without any side effects.  She denies any increased joint pain or joint swelling.  She states sometimes she has discomfort in the middle of the night that she feels more pressure on the area she is lying down.  She has history of insomnia.  She takes trazodone for insomnia.  She states her skin has been very dry.  She is also getting some rash on her extremities which is itchy.  Activities of Daily Living:  Patient reports morning stiffness for 5 minutes.   Patient Reports nocturnal pain.  Difficulty dressing/grooming: Denies Difficulty climbing stairs: Denies Difficulty getting out of chair: Denies Difficulty using hands for taps, buttons, cutlery, and/or writing: Reports  Review of Systems  Constitutional:  Positive for fatigue.  HENT:  Negative for mouth sores, mouth dryness and nose dryness.   Eyes:  Positive for dryness. Negative for pain and itching.  Respiratory:  Negative for shortness of breath and difficulty breathing.   Cardiovascular:  Negative for chest pain and palpitations.  Gastrointestinal:  Negative for blood in stool, constipation and diarrhea.  Endocrine: Negative for increased urination.  Genitourinary:  Negative for difficulty urinating.  Musculoskeletal:  Positive for morning stiffness. Negative for joint pain, joint pain, joint swelling, myalgias, muscle tenderness and myalgias.  Skin:  Positive for rash. Negative for color change.  Allergic/Immunologic: Negative for susceptible to infections.  Neurological:  Negative for dizziness, numbness,  headaches, memory loss and weakness.  Hematological:  Positive for bruising/bleeding tendency.  Psychiatric/Behavioral:  Negative for confusion.    PMFS History:  Patient Active Problem List   Diagnosis Date Noted   Rheumatoid arthritis involving multiple sites with positive rheumatoid factor ([place]) 02/26/2020   High risk medication use 02/26/2020   Primary insomnia 02/26/2020   Plantar fasciitis, left 02/26/2020   Osteopenia of multiple sites 02/26/2020    Past Medical History:  Diagnosis Date   Rheumatoid arthritis ([place])     Family History  Problem Relation Age of Onset   Colon cancer Paternal Grandfather        not sure age of onset   Cataracts Father    Healthy Son    Healthy Son    Past Surgical History:  Procedure Laterality Date   DILATION AND CURETTAGE OF UTERUS  2000   [place]   Social History   Social History Narrative   Not on file   Immunization History  Administered Date(s) Administered   PFIZER(Purple Top)SARS-COV-2 Vaccination 08/10/2019, 08/31/2019, 04/10/2020     Objective: Vital Signs: BP 119/78 (BP Location: Left Arm, Patient Position: Sitting, Cuff Size: Normal)   Pulse 69   Ht 5\' 1"  (1.549 m)   Wt 114 lb 9.6 oz (52 kg)   LMP 02/26/2014   BMI 21.65 kg/m    Physical Exam Vitals and nursing note reviewed.  Constitutional:      Appearance: She is well-developed.  HENT:     Head: Normocephalic and  atraumatic.  Eyes:     Conjunctiva/sclera: Conjunctivae normal.  Cardiovascular:     Rate and Rhythm: Normal rate and regular rhythm.     Heart sounds: Normal heart sounds.  Pulmonary:     Effort: Pulmonary effort is normal.     Breath sounds: Normal breath sounds.  Abdominal:     General: Bowel sounds are normal.     Palpations: Abdomen is soft.  Musculoskeletal:     Cervical back: Normal range of motion.  Lymphadenopathy:     Cervical: No cervical adenopathy.  Skin:    General: Skin is warm and dry.     Capillary  Refill: Capillary refill takes less than 2 seconds.     Comments: Fine erythematous lesions were noted on her bilateral forearms with some excoriation marks.  Dry skin was noted.  Neurological:     Mental Status: She is alert and oriented to person, place, and time.  Psychiatric:        Behavior: Behavior normal.     Musculoskeletal Exam: C-spine was in good range of motion.  Shoulder joints, elbow joints, wrist joints, MCPs PIPs and DIPs with good range of motion with no synovitis.  She had bilateral mild PIP and DIP thickening.  Hip joints and knee joints with good range of motion with no synovitis.  There was no tenderness over ankles or MTPs.  CDAI Exam: CDAI Score: 0  Patient Global: 0 mm; Provider Global: 0 mm Swollen: 0 ; Tender: 0  Joint Exam 06/02/2021   No joint exam has been documented for this visit   There is currently no information documented on the homunculus. Go to the Rheumatology activity and complete the homunculus joint exam.  Investigation: No additional findings.  Imaging: No results found.  Recent Labs: Lab Results  Component Value Date   WBC 4.1 12/29/2020   HGB 13.5 12/29/2020   PLT 228 12/29/2020   NA 137 12/29/2020   K 4.2 12/29/2020   CL 100 12/29/2020   CO2 30 12/29/2020   GLUCOSE 79 12/29/2020   BUN 24 12/29/2020   CREATININE 0.72 12/29/2020   BILITOT 0.5 12/29/2020   AST 20 12/29/2020   ALT 20 12/29/2020   PROT 6.9 12/29/2020   CALCIUM 9.5 12/29/2020   GFRAA 114 07/29/2020   QFTBGOLDPLUS NEGATIVE 05/02/2019    Speciality Comments: PLQ Eye Exam: 01/11/2021 Normal @ [facility] follow up in 12 months PLQ started in November 2020  Procedures:  No procedures performed Allergies: Patient has no known allergies.   Assessment / Plan:     Visit Diagnoses: Rheumatoid arthritis involving multiple sites with positive rheumatoid factor ([place]) - 04/22/19: ANA-, sed rate 75, RF 62.5.  She had no synovitis on examination.  She been  tolerating hydroxychloroquine without any side effects.  High risk medication use - Plaquenil 200 mg 1 tablet by mouth twice daily M-F (started at end of November 2020). PLQ Eye Exam: 01/11/2021  - Plan: CBC with Differential/Platelet, COMPLETE METABOLIC PANEL WITH GFR today and then every 5 months to monitor for drug toxicity.  Information on immunization was placed at the AVS.  Primary insomnia-she takes trazodone.  With sleep hygiene was discussed.  Vitamin D deficiency-last vitamin D level in July was normal.  Osteopenia of multiple sites - DEXA 07/19/19: Left distal radius: T-score -2.2.  Patient will need repeat DEXA scan in 2023.  Use of calcium rich diet, and resistive exercises were advised.  Increased risk of heart disease with rheumatoid arthritis  was discussed.  Dietary modifications and exercise was emphasized.  Orders: Orders Placed This Encounter  Procedures   CBC with Differential/Platelet   COMPLETE METABOLIC PANEL WITH GFR   No orders of the defined types were placed in this encounter.    Follow-Up Instructions: Return in about 5 months (around 10/31/2021) for Rheumatoid arthritis.   Pollyann Savoy, MD  Note - This record has been created using Animal nutritionist.  Chart creation errors have been sought, but may not always  have been located. Such creation errors do not reflect on  the standard of medical care.

## 2021-06-02 ENCOUNTER — Ambulatory Visit: Payer: BC Managed Care – PPO | Admitting: Rheumatology

## 2021-06-02 ENCOUNTER — Other Ambulatory Visit: Payer: Self-pay

## 2021-06-02 ENCOUNTER — Encounter: Payer: Self-pay | Admitting: Rheumatology

## 2021-06-02 VITALS — BP 119/78 | HR 69 | Ht 61.0 in | Wt 114.6 lb

## 2021-06-02 DIAGNOSIS — F5101 Primary insomnia: Secondary | ICD-10-CM | POA: Diagnosis not present

## 2021-06-02 DIAGNOSIS — Z79899 Other long term (current) drug therapy: Secondary | ICD-10-CM

## 2021-06-02 DIAGNOSIS — M8589 Other specified disorders of bone density and structure, multiple sites: Secondary | ICD-10-CM

## 2021-06-02 DIAGNOSIS — M0579 Rheumatoid arthritis with rheumatoid factor of multiple sites without organ or systems involvement: Secondary | ICD-10-CM

## 2021-06-02 DIAGNOSIS — E559 Vitamin D deficiency, unspecified: Secondary | ICD-10-CM

## 2021-06-02 LAB — COMPLETE METABOLIC PANEL WITH GFR
AG Ratio: 2 (calc) (ref 1.0–2.5)
ALT: 16 U/L (ref 6–29)
AST: 24 U/L (ref 10–35)
Albumin: 4.7 g/dL (ref 3.6–5.1)
Alkaline phosphatase (APISO): 65 U/L (ref 37–153)
BUN: 15 mg/dL (ref 7–25)
CO2: 30 mmol/L (ref 20–32)
Calcium: 9.6 mg/dL (ref 8.6–10.4)
Chloride: 102 mmol/L (ref 98–110)
Creat: 0.65 mg/dL (ref 0.50–1.03)
Globulin: 2.3 g/dL (calc) (ref 1.9–3.7)
Glucose, Bld: 72 mg/dL (ref 65–99)
Potassium: 4.1 mmol/L (ref 3.5–5.3)
Sodium: 140 mmol/L (ref 135–146)
Total Bilirubin: 0.5 mg/dL (ref 0.2–1.2)
Total Protein: 7 g/dL (ref 6.1–8.1)
eGFR: 102 mL/min/{1.73_m2} (ref >=60)

## 2021-06-02 LAB — CBC WITH DIFFERENTIAL/PLATELET
Absolute Monocytes: 420 cells/uL (ref 200–950)
Basophils Absolute: 30 cells/uL (ref 0–200)
Basophils Relative: 0.6 %
Eosinophils Absolute: 80 cells/uL (ref 15–500)
Eosinophils Relative: 1.6 %
HCT: 39.4 % (ref 35.0–45.0)
Hemoglobin: 13.5 g/dL (ref 11.7–15.5)
Lymphs Abs: 1535 cells/uL (ref 850–3900)
MCH: 32.1 pg (ref 27.0–33.0)
MCHC: 34.3 g/dL (ref 32.0–36.0)
MCV: 93.8 fL (ref 80.0–100.0)
MPV: 9.9 fL (ref 7.5–12.5)
Monocytes Relative: 8.4 %
Neutro Abs: 2935 cells/uL (ref 1500–7800)
Neutrophils Relative %: 58.7 %
Platelets: 228 10*3/uL (ref 140–400)
RBC: 4.2 10*6/uL (ref 3.80–5.10)
RDW: 11.7 % (ref 11.0–15.0)
Total Lymphocyte: 30.7 %
WBC: 5 10*3/uL (ref 3.8–10.8)

## 2021-06-02 NOTE — Patient Instructions (Signed)
Vaccines You are taking a medication(s) that can suppress your immune system.  The following immunizations are recommended: Flu annually Covid-19  Td/Tdap (tetanus, diphtheria, pertussis) every 10 years Pneumonia (Prevnar 15 then Pneumovax 23 at least 1 year apart.  Alternatively, can take Prevnar 20 without needing additional dose) Shingrix: 2 doses from 4 weeks to 6 months apart  Please check with your PCP to make sure you are up to date.   Heart Disease Prevention   Your inflammatory disease increases your risk of heart disease which includes heart attack, stroke, atrial fibrillation (irregular heartbeats), high blood pressure, heart failure and atherosclerosis (plaque in the arteries).  It is important to reduce your risk by:   Keep blood pressure, cholesterol, and blood sugar at healthy levels   Smoking Cessation   Maintain a healthy weight  BMI 20-25   Eat a healthy diet  Plenty of fresh fruit, vegetables, and whole grains  Limit saturated fats, foods high in sodium, and added sugars  DASH and Mediterranean diet   Increase physical activity  Recommend moderate physically activity for 150 minutes per week/ 30 minutes a day for five days a week These can be broken up into three separate ten-minute sessions during the day.   Reduce Stress  Meditation, slow breathing exercises, yoga, coloring books  Dental visits twice a year   

## 2021-06-03 NOTE — Progress Notes (Signed)
CBC and CMP normal

## 2021-06-05 ENCOUNTER — Other Ambulatory Visit: Payer: Self-pay | Admitting: Physician Assistant

## 2021-06-05 DIAGNOSIS — M0579 Rheumatoid arthritis with rheumatoid factor of multiple sites without organ or systems involvement: Secondary | ICD-10-CM

## 2021-06-08 NOTE — Telephone Encounter (Signed)
Next Visit: 11/10/2021  Last Visit: 06/02/2021  Labs: 06/02/2021 CBC and CMP normal.  Eye exam: 01/11/2021 Normal    Current Dose per office note 06/02/2021: Plaquenil 200 mg 1 tablet by mouth twice daily M-F  [id number] arthritis involving multiple sites with positive rheumatoid factor .  Last Fill: 03/15/2021  Okay to refill Plaquenil?

## 2021-09-09 ENCOUNTER — Other Ambulatory Visit: Payer: Self-pay | Admitting: Physician Assistant

## 2021-09-09 DIAGNOSIS — M0579 Rheumatoid arthritis with rheumatoid factor of multiple sites without organ or systems involvement: Secondary | ICD-10-CM

## 2021-09-09 NOTE — Telephone Encounter (Signed)
Next Visit: 11/10/2021 ?  ?Last Visit: 06/02/2021 ?  ?Labs: 06/02/2021 CBC and CMP normal. ?  ?Eye exam: 01/11/2021 Normal   ?  ?Current Dose per office note 06/02/2021: Plaquenil 200 mg 1 tablet by mouth twice daily M-F ?  ?[id number] arthritis involving multiple sites with positive rheumatoid factor . ?  ?Last Fill: 06/08/2021 ?  ?Okay to refill Plaquenil? ?

## 2021-10-27 NOTE — Progress Notes (Signed)
Office Visit Note  Patient: Carmen Christensen             Date of Birth: 05/27/1961           MRN: [id number]             PCP: Bernerd Limbo, MD Referring: Bernerd Limbo, MD Visit Date: 11/10/2021 Occupation: @GUAROCC @  Subjective:  Medication management  History of Present Illness: Carmen Christensen is a 61 y.o. female with history of sero positive rheumatoid arthritis and osteopenia.  She denies any increased joint pain or joint swelling.  She has been taking hydroxychloroquine 200 mg p.o. twice daily Monday to Friday without any side effects.  She also had an eye examination in August 2022 which was normal.  She states she had a recent bone density through her PCP.  She has been taking calcium and vitamin D and has been exercising on a regular basis.  She states that she had an episode of oral ulcers in January which lasted for couple of weeks and resolved.  She had no recurrence of oral ulcers.  Activities of Daily Living:  Patient reports morning stiffness for a few minutes.   Patient Reports nocturnal pain.  Difficulty dressing/grooming: Denies Difficulty climbing stairs: Denies Difficulty getting out of chair: Denies Difficulty using hands for taps, buttons, cutlery, and/or writing: Reports  Review of Systems  Constitutional:  Positive for fatigue.  HENT:  Positive for nose dryness. Negative for mouth dryness.   Eyes:  Positive for dryness. Negative for pain and itching.  Respiratory:  Negative for shortness of breath and difficulty breathing.   Cardiovascular:  Negative for chest pain and palpitations.  Gastrointestinal:  Negative for blood in stool, constipation and diarrhea.  Endocrine: Negative for increased urination.  Genitourinary:  Negative for difficulty urinating.  Musculoskeletal:  Positive for joint pain, joint pain and morning stiffness. Negative for joint swelling, myalgias, muscle tenderness and myalgias.  Skin:  Positive for rash. Negative for color change and  sensitivity to sunlight.  Allergic/Immunologic: Negative for susceptible to infections.  Neurological:  Negative for dizziness, numbness, headaches, memory loss and weakness.  Hematological:  Positive for bruising/bleeding tendency.  Psychiatric/Behavioral:  Positive for sleep disturbance. Negative for confusion.    PMFS History:  Patient Active Problem List   Diagnosis Date Noted   Rheumatoid arthritis involving multiple sites with positive rheumatoid factor ([place]) 02/26/2020   High risk medication use 02/26/2020   Primary insomnia 02/26/2020   Plantar fasciitis, left 02/26/2020   Osteopenia of multiple sites 02/26/2020    Past Medical History:  Diagnosis Date   Rheumatoid arthritis ([place])     Family History  Problem Relation Age of Onset   Colon cancer Paternal Grandfather        not sure age of onset   Cataracts Father    Healthy Son    Healthy Son    Past Surgical History:  Procedure Laterality Date   DILATION AND CURETTAGE OF UTERUS  2000   [place]   Social History   Social History Narrative   Not on file   Immunization History  Administered Date(s) Administered   PFIZER(Purple Top)SARS-COV-2 Vaccination 08/10/2019, 08/31/2019, 04/10/2020   Pfizer Covid-19 Vaccine Bivalent Booster 15yrs & up 06/03/2021     Objective: Vital Signs: BP 118/73 (BP Location: Left Arm, Patient Position: Sitting, Cuff Size: Normal)   Pulse 71   Ht 5\' 1"  (1.549 m)   Wt 118 lb 6.4 oz (53.7 kg)  LMP 02/26/2014   BMI 22.37 kg/m    Physical Exam Vitals and nursing note reviewed.  Constitutional:      Appearance: She is well-developed.  HENT:     Head: Normocephalic and atraumatic.  Eyes:     Conjunctiva/sclera: Conjunctivae normal.  Cardiovascular:     Rate and Rhythm: Normal rate and regular rhythm.     Heart sounds: Normal heart sounds.  Pulmonary:     Effort: Pulmonary effort is normal.     Breath sounds: Normal breath sounds.  Abdominal:     General:  Bowel sounds are normal.     Palpations: Abdomen is soft.  Musculoskeletal:     Cervical back: Normal range of motion.  Lymphadenopathy:     Cervical: No cervical adenopathy.  Skin:    General: Skin is warm and dry.     Capillary Refill: Capillary refill takes less than 2 seconds.  Neurological:     Mental Status: She is alert and oriented to person, place, and time.  Psychiatric:        Behavior: Behavior normal.     Musculoskeletal Exam: C-spine, thoracic and lumbar spine were in good range of motion.  Shoulder joints, elbow joints, wrist joints, MCPs PIPs and DIPs with good range of motion with no synovitis.  She had bilateral PIP and DIP thickening.  Hip joints and knee joints in good range of motion.  There was no tenderness over ankles or MTPs.  CDAI Exam: CDAI Score: 0.2  Patient Global: 1 mm; Provider Global: 1 mm Swollen: 0 ; Tender: 0  Joint Exam 11/10/2021   No joint exam has been documented for this visit   There is currently no information documented on the homunculus. Go to the Rheumatology activity and complete the homunculus joint exam.  Investigation: No additional findings.  Imaging: No results found.  Recent Labs: Lab Results  Component Value Date   WBC 5.0 06/02/2021   HGB 13.5 06/02/2021   PLT 228 06/02/2021   NA 140 06/02/2021   K 4.1 06/02/2021   CL 102 06/02/2021   CO2 30 06/02/2021   GLUCOSE 72 06/02/2021   BUN 15 06/02/2021   CREATININE 0.65 06/02/2021   BILITOT 0.5 06/02/2021   AST 24 06/02/2021   ALT 16 06/02/2021   PROT 7.0 06/02/2021   CALCIUM 9.6 06/02/2021   GFRAA 114 07/29/2020   QFTBGOLDPLUS NEGATIVE 05/02/2019   August 11, 2021 left femoral neck T score -1.4, BMD 0.850 no comparison.  Lumbar spine- 0.4, BMD 1.139, no comparison, left forearm T score -0.0 no comparison  July 19, 2019 lumbar spine T score 0.0, BMD 1.198, left femoral neck 0.883, T score -1.1, left forearm T score -2.2, BMD 0.366   Speciality Comments: PLQ  Eye Exam: 01/11/2021 Normal @ [facility] follow up in 12 months PLQ started in November 2020  Procedures:  No procedures performed Allergies: Patient has no known allergies.   Assessment / Plan:     Visit Diagnoses: Rheumatoid arthritis involving multiple sites with positive rheumatoid factor ([place]) - 04/22/19: ANA-, sed rate 75, RF 62.5.  She has been doing well on hydroxychloroquine 1 tablet p.o. twice daily Monday to Friday.  She had no synovitis on examination.  She continues to have some discomfort in her hands due to underlying osteoarthritis.  Joint protection was discussed.  High risk medication use - Plaquenil 200 mg 1 tablet by mouth twice daily M-F (started at end of November 2020). PLQ Eye Exam: 01/11/2021 -we  will check labs today and then every 5 months.  Plan: CBC with Differential/Platelet, COMPLETE METABOLIC PANEL WITH GFR.  Information regarding vaccination was placed in the AVS.  Primary insomnia-her insomnia is better on trazodone.  Vitamin D deficiency -she has history of vitamin D deficiency.  She has been taking calcium with vitamin D.  We will check vitamin D level today.  Plan: VITAMIN D 25 Hydroxy (Vit-D Deficiency, Fractures)  Osteopenia of multiple sites -patient states that she had bone density on the same machine.  I reviewed the March report with her which she states no comparison.  Based on the BMD values and T score she appears to have progression of osteopenia in her lumbar spine and femoral neck.  There is improvement in the BMD of her forearm.   August 11, 2021 left femoral neck T score -1.4, BMD 0.850 no comparison.  Lumbar spine- 0.4, BMD 1.139, no comparison, left forearm T score -0.0 no comparison.  July 19, 2019 lumbar spine T score 0.0, BMD 1.198, left femoral neck 0.883, T score -1.1, left forearm T score -2.2, BMD 0.366 -I did detailed discussion with the patient regarding osteopenia.  We discussed the option of Evista but she declined.  Use of  calcium and vitamin D was discussed.  We also discussed if she has further deterioration at the follow-up DEXA scan then we may consider bisphosphonates.  She has been walking every day.  Muscle strengthening exercises were also advised.  We will check calcium level today.  I would like to keep her calcium level between 40 and 50.  Plan: VITAMIN D 25 Hydroxy (Vit-D Deficiency, Fractures)  Orders: Orders Placed This Encounter  Procedures   CBC with Differential/Platelet   COMPLETE METABOLIC PANEL WITH GFR   VITAMIN D 25 Hydroxy (Vit-D Deficiency, Fractures)   No orders of the defined types were placed in this encounter.   Face-to-face time spent with patient was 30 minutes. Greater than 50% of time was spent in counseling and coordination of care.  Follow-Up Instructions: Return in about 5 months (around 04/12/2022) for Rheumatoid arthritis.   Bo Merino, MD  Note - This record has been created using Editor, commissioning.  Chart creation errors have been sought, but may not always  have been located. Such creation errors do not reflect on  the standard of medical care.

## 2021-11-10 ENCOUNTER — Encounter: Payer: Self-pay | Admitting: Rheumatology

## 2021-11-10 ENCOUNTER — Ambulatory Visit: Payer: BC Managed Care – PPO | Admitting: Rheumatology

## 2021-11-10 VITALS — BP 118/73 | HR 71 | Ht 61.0 in | Wt 118.4 lb

## 2021-11-10 DIAGNOSIS — M0579 Rheumatoid arthritis with rheumatoid factor of multiple sites without organ or systems involvement: Secondary | ICD-10-CM | POA: Diagnosis not present

## 2021-11-10 DIAGNOSIS — E559 Vitamin D deficiency, unspecified: Secondary | ICD-10-CM | POA: Diagnosis not present

## 2021-11-10 DIAGNOSIS — M8589 Other specified disorders of bone density and structure, multiple sites: Secondary | ICD-10-CM

## 2021-11-10 DIAGNOSIS — F5101 Primary insomnia: Secondary | ICD-10-CM | POA: Diagnosis not present

## 2021-11-10 DIAGNOSIS — Z79899 Other long term (current) drug therapy: Secondary | ICD-10-CM

## 2021-11-10 NOTE — Patient Instructions (Signed)
Standing Labs We placed an order today for your standing lab work.   Please have your standing labs drawn in October  If possible, please have your labs drawn 2 weeks prior to your appointment so that the provider can discuss your results at your appointment.  Please note that you may see your imaging and lab results in MyChart before we have reviewed them. We may be awaiting multiple results to interpret others before contacting you. Please allow our office up to 72 hours to thoroughly review all of the results before contacting the office for clarification of your results.  We have open lab daily: Monday through Thursday from 1:30-4:30 PM and Friday from 1:30-4:00 PM at the office of Dr. Angely Dietz, [facility].   Please be advised, all patients with office appointments requiring lab work will take precedent over walk-in lab work.  If possible, please come for your lab work on Monday and Friday afternoons, as you may experience shorter wait times. The office is located at [address], [address], [place], [place] [address] No appointment is necessary.   Labs are drawn by [facility]. Please bring your co-pay at the time of your lab draw.  You may receive a bill from [facility] for your lab work.  Please note if you are on Hydroxychloroquine and and an order has been placed for a Hydroxychloroquine level, you will need to have it drawn 4 hours or more after your last dose.  If you wish to have your labs drawn at another location, please call the office 24 hours in advance to send orders.  If you have any questions regarding directions or hours of operation,  please call [phone].   As a reminder, please drink plenty of water prior to coming for your lab work. Thanks!   Vaccines You are taking a medication(s) that can suppress your immune system.  The following immunizations are recommended: Flu annually Covid-19  Td/Tdap (tetanus, diphtheria, pertussis) every 10  years Pneumonia (Prevnar 15 then Pneumovax 23 at least 1 year apart.  Alternatively, can take Prevnar 20 without needing additional dose) Shingrix: 2 doses from 4 weeks to 6 months apart  Please check with your PCP to make sure you are up to date.  

## 2021-11-11 LAB — CBC WITH DIFFERENTIAL/PLATELET
Absolute Monocytes: 442 cells/uL (ref 200–950)
Basophils Absolute: 32 cells/uL (ref 0–200)
Basophils Relative: 0.7 %
Eosinophils Absolute: 41 cells/uL (ref 15–500)
Eosinophils Relative: 0.9 %
HCT: 37.4 % (ref 35.0–45.0)
Hemoglobin: 13 g/dL (ref 11.7–15.5)
Lymphs Abs: 1339 cells/uL (ref 850–3900)
MCH: 32.2 pg (ref 27.0–33.0)
MCHC: 34.8 g/dL (ref 32.0–36.0)
MCV: 92.6 fL (ref 80.0–100.0)
MPV: 9.9 fL (ref 7.5–12.5)
Monocytes Relative: 9.6 %
Neutro Abs: 2746 cells/uL (ref 1500–7800)
Neutrophils Relative %: 59.7 %
Platelets: 223 10*3/uL (ref 140–400)
RBC: 4.04 10*6/uL (ref 3.80–5.10)
RDW: 11.8 % (ref 11.0–15.0)
Total Lymphocyte: 29.1 %
WBC: 4.6 10*3/uL (ref 3.8–10.8)

## 2021-11-11 LAB — COMPLETE METABOLIC PANEL WITH GFR
AG Ratio: 2.2 (calc) (ref 1.0–2.5)
ALT: 15 U/L (ref 6–29)
AST: 20 U/L (ref 10–35)
Albumin: 4.6 g/dL (ref 3.6–5.1)
Alkaline phosphatase (APISO): 58 U/L (ref 37–153)
BUN: 20 mg/dL (ref 7–25)
CO2: 32 mmol/L (ref 20–32)
Calcium: 9.7 mg/dL (ref 8.6–10.4)
Chloride: 101 mmol/L (ref 98–110)
Creat: 0.74 mg/dL (ref 0.50–1.03)
Globulin: 2.1 g/dL (calc) (ref 1.9–3.7)
Glucose, Bld: 77 mg/dL (ref 65–99)
Potassium: 4.4 mmol/L (ref 3.5–5.3)
Sodium: 140 mmol/L (ref 135–146)
Total Bilirubin: 0.4 mg/dL (ref 0.2–1.2)
Total Protein: 6.7 g/dL (ref 6.1–8.1)
eGFR: 94 mL/min/{1.73_m2} (ref >=60)

## 2021-11-11 LAB — VITAMIN D 25 HYDROXY (VIT D DEFICIENCY, FRACTURES): Vit D, 25-Hydroxy: 41 ng/mL (ref 30–100)

## 2021-11-11 NOTE — Progress Notes (Signed)
CBC, CMP and vitamin D are within normal limits.

## 2021-11-11 NOTE — Progress Notes (Signed)
Her vitamin D is 41.  The goal is to keep vitamin D between 40 and 50.  She may take vitamin D 1000 units daily.

## 2021-12-07 ENCOUNTER — Other Ambulatory Visit: Payer: Self-pay | Admitting: Physician Assistant

## 2021-12-07 DIAGNOSIS — M0579 Rheumatoid arthritis with rheumatoid factor of multiple sites without organ or systems involvement: Secondary | ICD-10-CM

## 2022-03-13 HISTORY — PX: SQUAMOUS CELL CARCINOMA EXCISION: SHX2433

## 2022-03-31 NOTE — Progress Notes (Unsigned)
Office Visit Note  Patient: Carmen Christensen             Date of Birth: 1960-07-29           MRN: [id number]             PCP: Bernerd Limbo, MD Referring: Bernerd Limbo, MD Visit Date: 04/14/2022 Occupation: @GUAROCC @  Subjective:  Pain in both hands   History of Present Illness: Carmen Christensen is a 61 y.o. female with history of seropositive rheumatoid arthritis. She is taking Plaquenil 200 mg 1 tablet by mouth twice daily M-F.  She is tolerating Plaquenil without any side effects and has not missed any doses recently.  She denies any signs or symptoms of a rheumatoid arthritis flare.  She has occasional pain in her hands especially when writing or opening jars.  She denies any joint swelling at this time.  Her morning stiffness has only been lasting a few minutes daily.  She continues to remain active.  She is not walking for exercise as much as she would like to.  She denies any recent or recurrent infections.  She has had the flu shot and is up-to-date with the Shingrix vaccine.  She is considering getting the COVID booster at her yearly physical in December 2023.    Activities of Daily Living:  Patient reports morning stiffness for a few minutes.   Patient Denies nocturnal pain.  Difficulty dressing/grooming: Denies Difficulty climbing stairs: Denies Difficulty getting out of chair: Denies Difficulty using hands for taps, buttons, cutlery, and/or writing: Reports  Review of Systems  Constitutional:  Positive for fatigue.  HENT:  Negative for mouth sores and mouth dryness.   Eyes:  Positive for dryness.  Respiratory:  Negative for shortness of breath.   Cardiovascular:  Negative for chest pain and palpitations.  Gastrointestinal:  Negative for blood in stool, constipation and diarrhea.  Endocrine: Negative for increased urination.  Genitourinary:  Negative for involuntary urination.  Musculoskeletal:  Positive for joint pain, joint pain and morning stiffness. Negative for  gait problem, joint swelling, myalgias, muscle weakness, muscle tenderness and myalgias.  Skin:  Positive for sensitivity to sunlight. Negative for color change, rash and hair loss.  Allergic/Immunologic: Negative for susceptible to infections.  Neurological:  Negative for dizziness and headaches.  Hematological:  Negative for swollen glands.  Psychiatric/Behavioral:  Positive for sleep disturbance. Negative for depressed mood. The patient is nervous/anxious.     PMFS History:  Patient Active Problem List   Diagnosis Date Noted   Rheumatoid arthritis involving multiple sites with positive rheumatoid factor ([place]) 02/26/2020   High risk medication use 02/26/2020   Primary insomnia 02/26/2020   Plantar fasciitis, left 02/26/2020   Osteopenia of multiple sites 02/26/2020    Past Medical History:  Diagnosis Date   Rheumatoid arthritis ([place])     Family History  Problem Relation Age of Onset   Colon cancer Paternal Grandfather        not sure age of onset   Cataracts Father    Healthy Son    Healthy Son    Past Surgical History:  Procedure Laterality Date   DILATION AND CURETTAGE OF UTERUS  06/13/1998   NASAL SINUS SURGERY  06/14/1991   SQUAMOUS CELL CARCINOMA EXCISION Right 03/2022   leg   Social History   Social History Narrative   Not on file   Immunization History  Administered Date(s) Administered   PFIZER(Purple Top)SARS-COV-2 Vaccination 08/10/2019, 08/31/2019, 04/10/2020   Pfizer Covid-19 Vaccine  Bivalent Booster 31yrs & up 06/03/2021     Objective: Vital Signs: BP 117/77 (BP Location: Left Arm, Patient Position: Sitting, Cuff Size: Normal)   Pulse 81   Resp 14   Ht 5' 0.5" (1.537 m)   Wt 118 lb 6.4 oz (53.7 kg)   LMP 02/26/2014   BMI 22.74 kg/m    Physical Exam Vitals and nursing note reviewed.  Constitutional:      Appearance: She is well-developed.  HENT:     Head: Normocephalic and atraumatic.  Eyes:     Conjunctiva/sclera: Conjunctivae normal.   Cardiovascular:     Rate and Rhythm: Normal rate and regular rhythm.     Heart sounds: Normal heart sounds.  Pulmonary:     Effort: Pulmonary effort is normal.     Breath sounds: Normal breath sounds.  Abdominal:     General: Bowel sounds are normal.     Palpations: Abdomen is soft.  Musculoskeletal:     Cervical back: Normal range of motion.  Skin:    General: Skin is warm and dry.     Capillary Refill: Capillary refill takes less than 2 seconds.  Neurological:     Mental Status: She is alert and oriented to person, place, and time.  Psychiatric:        Behavior: Behavior normal.      Musculoskeletal Exam: C-spine, thoracic spine, lumbar spine have good range of motion.  No midline spinal tenderness.  Shoulder joints, elbow joints, wrist joints, MCPs, PIPs, DIPs have good range of motion with no synovitis.  Complete fist formation bilaterally.  Hip joints have good range of motion with no groin pain.  Knee joints have good range of motion with no warmth or effusion.  Ankle joints have good range of motion with no tenderness or synovitis.  No evidence of Achilles tendinitis or plantar fasciitis.  No tenderness or synovitis over MTP joints.  CDAI Exam: CDAI Score: -- Patient Global: 1 mm; Provider Global: 1 mm Swollen: --; Tender: -- Joint Exam 04/14/2022   No joint exam has been documented for this visit   There is currently no information documented on the homunculus. Go to the Rheumatology activity and complete the homunculus joint exam.  Investigation: No additional findings.  Imaging: No results found.  Recent Labs: Lab Results  Component Value Date   WBC 4.6 11/10/2021   HGB 13.0 11/10/2021   PLT 223 11/10/2021   NA 140 11/10/2021   K 4.4 11/10/2021   CL 101 11/10/2021   CO2 32 11/10/2021   GLUCOSE 77 11/10/2021   BUN 20 11/10/2021   CREATININE 0.74 11/10/2021   BILITOT 0.4 11/10/2021   AST 20 11/10/2021   ALT 15 11/10/2021   PROT 6.7 11/10/2021    CALCIUM 9.7 11/10/2021   GFRAA 114 07/29/2020   QFTBGOLDPLUS NEGATIVE 05/02/2019    Speciality Comments: PLQ Eye Exam: 01/17/2022 Normal @ [facility] follow up in 6  months PLQ started in November 2020  Procedures:  No procedures performed Allergies: Patient has no known allergies.   Assessment / Plan:     Visit Diagnoses: Rheumatoid arthritis involving multiple sites with positive rheumatoid factor (HCC) - 04/22/19: ANA-, sed rate 75, RF 62.5: She has no synovitis on examination today.  She has not had any signs or symptoms of a rheumatoid arthritis flare.  She has clinically been doing well taking Plaquenil 200 mg 1 tablet by mouth twice daily Monday through Friday.  She is tolerating Plaquenil without any  side effects.  She has not missed any doses of Plaquenil recently.  She has not had any recent or recurrent infections.  Her morning stiffness has only been lasting a few minutes daily.  She has occasional discomfort in her hands especially if writing or opening jars.  She has not noticed any joint swelling.  She remains active.  She will remain on Plaquenil as monotherapy.  She was advised to notify us if she develops increased joint pain or joint swelling.  She will follow-up in the office in 5 months or sooner if needed.  High risk medication use - Plaquenil 200 mg 1 tablet by mouth twice daily M-F (started at end of November 2020). PLQ Eye Exam: 01/17/2022  - Plan: CBC with Differential/Platelet, COMPLETE METABOLIC PANEL WITH GFR Plaquenil was started in November 2020.  CBC and CMP updated on 11/09/21.  Orders for CBC and CMP released today.  PLQ Eye Exam: 01/17/2022 Normal @ [facility].  She received the annual flu shot.  She is considering getting the COVID-19 booster in December 2023 at her upcoming yearly physical.  She is up-to-date with the Shingrix vaccine.  Primary insomnia: She is taking trazodone 50 mg at bedtime for insomnia.  Vitamin D deficiency:  Vitamin D was 41 on 11/10/2021.  Osteopenia of multiple sites:  Updated DEXA 08/11/2021 left femoral neck T score -1.4, BMD 0.850 no comparison.  Lumbar spine- 0.4, BMD 1.139, no comparison, left forearm T score -0.0 no comparison.   Previous DEXA 07/19/2019 lumbar spine T score 0.0, BMD 1.198, left femoral neck 0.883, T score -1.1, left forearm T score -2.2, BMD 0.3.   Vitamin D was 41 on 11/10/21.  Discussed the importance of taking a calcium and vitamin D supplement as well as performing resistive exercises. Due to update DEXA March 2025.  History of squamous cell carcinoma -Biopsy-proven -Dr. Dan Jones-October 2023  Orders: Orders Placed This Encounter  Procedures   CBC with Differential/Platelet   COMPLETE METABOLIC PANEL WITH GFR   No orders of the defined types were placed in this encounter.    Follow-Up Instructions: Return in about 5 months (around 09/13/2022) for Rheumatoid arthritis.   Gearldine Bienenstock, PA-C  Note - This record has been created using Dragon software.  Chart creation errors have been sought, but may not always  have been located. Such creation errors do not reflect on  the standard of medical care.

## 2022-04-06 ENCOUNTER — Other Ambulatory Visit: Payer: Self-pay | Admitting: Physician Assistant

## 2022-04-06 DIAGNOSIS — M0579 Rheumatoid arthritis with rheumatoid factor of multiple sites without organ or systems involvement: Secondary | ICD-10-CM

## 2022-04-06 NOTE — Telephone Encounter (Signed)
Next Visit: 04/14/2022   Last Visit: 11/10/2021   Labs: 11/10/2021 CBC, CMP and vitamin D are within normal limits   Eye exam: 01/17/2022 Normal   Current Dose per office note 11/10/2021: Plaquenil 200 mg 1 tablet by mouth twice daily M-F    NU:[id number] arthritis involving multiple sites with positive rheumatoid factor    Last Fill: 12/07/2021    Okay to refill Plaquenil?

## 2022-04-14 ENCOUNTER — Ambulatory Visit: Payer: BC Managed Care – PPO | Attending: Physician Assistant | Admitting: Physician Assistant

## 2022-04-14 ENCOUNTER — Encounter: Payer: Self-pay | Admitting: Physician Assistant

## 2022-04-14 VITALS — BP 117/77 | HR 81 | Resp 14 | Ht 60.5 in | Wt 118.4 lb

## 2022-04-14 DIAGNOSIS — M8589 Other specified disorders of bone density and structure, multiple sites: Secondary | ICD-10-CM

## 2022-04-14 DIAGNOSIS — M0579 Rheumatoid arthritis with rheumatoid factor of multiple sites without organ or systems involvement: Secondary | ICD-10-CM

## 2022-04-14 DIAGNOSIS — E559 Vitamin D deficiency, unspecified: Secondary | ICD-10-CM

## 2022-04-14 DIAGNOSIS — F5101 Primary insomnia: Secondary | ICD-10-CM | POA: Diagnosis not present

## 2022-04-14 DIAGNOSIS — Z8589 Personal history of malignant neoplasm of other organs and systems: Secondary | ICD-10-CM

## 2022-04-14 DIAGNOSIS — Z79899 Other long term (current) drug therapy: Secondary | ICD-10-CM

## 2022-04-15 LAB — CBC WITH DIFFERENTIAL/PLATELET
Absolute Monocytes: 403 cells/uL (ref 200–950)
Basophils Absolute: 38 cells/uL (ref 0–200)
Basophils Relative: 0.8 %
Eosinophils Absolute: 91 cells/uL (ref 15–500)
Eosinophils Relative: 1.9 %
HCT: 37.4 % (ref 35.0–45.0)
Hemoglobin: 13 g/dL (ref 11.7–15.5)
Lymphs Abs: 1075 cells/uL (ref 850–3900)
MCH: 32 pg (ref 27.0–33.0)
MCHC: 34.8 g/dL (ref 32.0–36.0)
MCV: 92.1 fL (ref 80.0–100.0)
MPV: 10.2 fL (ref 7.5–12.5)
Monocytes Relative: 8.4 %
Neutro Abs: 3192 cells/uL (ref 1500–7800)
Neutrophils Relative %: 66.5 %
Platelets: 231 10*3/uL (ref 140–400)
RBC: 4.06 10*6/uL (ref 3.80–5.10)
RDW: 11.6 % (ref 11.0–15.0)
Total Lymphocyte: 22.4 %
WBC: 4.8 10*3/uL (ref 3.8–10.8)

## 2022-04-15 LAB — COMPLETE METABOLIC PANEL WITH GFR
AG Ratio: 1.9 (calc) (ref 1.0–2.5)
ALT: 16 U/L (ref 6–29)
AST: 20 U/L (ref 10–35)
Albumin: 4.3 g/dL (ref 3.6–5.1)
Alkaline phosphatase (APISO): 57 U/L (ref 37–153)
BUN/Creatinine Ratio: 17 (calc) (ref 6–22)
BUN: 20 mg/dL (ref 7–25)
CO2: 31 mmol/L (ref 20–32)
Calcium: 9.4 mg/dL (ref 8.6–10.4)
Chloride: 104 mmol/L (ref 98–110)
Creat: 1.15 mg/dL — ABNORMAL HIGH (ref 0.50–1.03)
Globulin: 2.3 g/dL (calc) (ref 1.9–3.7)
Glucose, Bld: 76 mg/dL (ref 65–99)
Potassium: 4.4 mmol/L (ref 3.5–5.3)
Sodium: 142 mmol/L (ref 135–146)
Total Bilirubin: 0.4 mg/dL (ref 0.2–1.2)
Total Protein: 6.6 g/dL (ref 6.1–8.1)
eGFR: 55 mL/min/{1.73_m2} — ABNORMAL LOW (ref >=60)

## 2022-04-15 NOTE — Progress Notes (Signed)
CBC WNL. Creatinine is elevated-1.15 and GFR is low-55.  Please clarify if she has been taking any NSAIDs or if she has had any other medication changes?

## 2022-04-15 NOTE — Progress Notes (Signed)
Please forward lab work to PCP.  She can follow up with PCP to have BMP with Gfr rechecked.

## 2022-04-15 NOTE — Progress Notes (Signed)
If she is not taking NSAIDs I would recommend following up with PCP to further discuss results/next steps or we can refer her to nephrology.

## 2022-06-30 ENCOUNTER — Other Ambulatory Visit: Payer: Self-pay | Admitting: Physician Assistant

## 2022-06-30 DIAGNOSIS — M0579 Rheumatoid arthritis with rheumatoid factor of multiple sites without organ or systems involvement: Secondary | ICD-10-CM

## 2022-07-01 NOTE — Telephone Encounter (Signed)
Next Visit: 09/15/2022  Last Visit: 04/14/2022  Labs: 04/14/2022 CBC WNL. Creatinine is elevated-1.15 and GFR is low-55.   Eye exam: 01/17/2022 Normal   Current Dose per office note 04/14/2022: Plaquenil 200 mg 1 tablet by mouth twice daily M-F   GU:[id number] arthritis involving multiple sites with positive rheumatoid factor   Last Fill: 04/06/2022  Okay to refill Plaquenil?

## 2022-09-06 ENCOUNTER — Other Ambulatory Visit: Payer: Self-pay | Admitting: Physician Assistant

## 2022-09-06 DIAGNOSIS — M0579 Rheumatoid arthritis with rheumatoid factor of multiple sites without organ or systems involvement: Secondary | ICD-10-CM

## 2022-09-06 NOTE — Telephone Encounter (Signed)
Last Fill: 07/01/2022  Eye exam: 01/17/2022   Labs: 04/14/2022 CBC WNL. Creatinine is elevated-1.15 and GFR is low-55.  Please clarify if she has been taking any NSAIDs or if she has had any other medication changes?  Next Visit: 09/15/2022  Last Visit: 04/15/2022  DX: Rheumatoid arthritis involving multiple sites with positive rheumatoid factor   Current Dose per office note 04/14/2022: Plaquenil 200 mg 1 tablet by mouth twice daily M-F   Okay to refill Plaquenil?

## 2022-09-07 NOTE — Progress Notes (Signed)
Office Visit Note  Patient: Carmen Christensen             Date of Birth: 1961-03-04           MRN: [id number]             PCP: Bernerd Limbo, MD Referring: Bernerd Limbo, MD Visit Date: 09/15/2022 Occupation: @GUAROCC @  Subjective:  Medication management  History of Present Illness: Carmen Christensen is a 62 y.o. female with history of seropositive rheumatoid arthritis.  She states last week she had some discomfort in her left tendons which improved gradually.  She also had some discomfort in her left knee which resolved.  She has not noticed any joint swelling.  She believes the discomfort was due to inflammation in the tendon.  She has been taking hydroxychloroquine 200 mg p.o. twice daily Monday to Friday without any interruption.  She denies any side effects from hydroxychloroquine.  She states last winter she had upper respiratory tract infection which was most likely flu.    Activities of Daily Living:  Patient reports morning stiffness for 0  none .   Patient Reports nocturnal pain.  Difficulty dressing/grooming: Denies Difficulty climbing stairs: Denies Difficulty getting out of chair: Denies Difficulty using hands for taps, buttons, cutlery, and/or writing: Reports  Review of Systems  Constitutional:  Positive for fatigue.  HENT:  Negative for mouth sores and mouth dryness.   Eyes:  Positive for dryness.  Respiratory:  Negative for shortness of breath.   Cardiovascular:  Negative for chest pain and palpitations.  Gastrointestinal:  Negative for blood in stool, constipation and diarrhea.  Endocrine: Negative for increased urination.  Genitourinary:  Negative for involuntary urination.  Musculoskeletal:  Positive for joint pain, joint pain and muscle tenderness. Negative for gait problem, joint swelling, myalgias, muscle weakness, morning stiffness and myalgias.  Skin:  Negative for color change, rash, hair loss and sensitivity to sunlight.  Allergic/Immunologic: Negative for  susceptible to infections.  Neurological:  Negative for dizziness and headaches.  Hematological:  Negative for swollen glands.  Psychiatric/Behavioral:  Positive for sleep disturbance. Negative for depressed mood. The patient is nervous/anxious.     PMFS History:  Patient Active Problem List   Diagnosis Date Noted   Rheumatoid arthritis involving multiple sites with positive rheumatoid factor 02/26/2020   High risk medication use 02/26/2020   Primary insomnia 02/26/2020   Plantar fasciitis, left 02/26/2020   Osteopenia of multiple sites 02/26/2020    Past Medical History:  Diagnosis Date   Rheumatoid arthritis     Family History  Problem Relation Age of Onset   Colon cancer Paternal Grandfather        not sure age of onset   Cataracts Father    Healthy Son    Healthy Son    Past Surgical History:  Procedure Laterality Date   DILATION AND CURETTAGE OF UTERUS  06/13/1998   NASAL SINUS SURGERY  06/14/1991   SQUAMOUS CELL CARCINOMA EXCISION Right 03/2022   leg   Social History   Social History Narrative   Not on file   Immunization History  Administered Date(s) Administered   COVID-19, mRNA, vaccine(Comirnaty)12 years and older 06/10/2022   PFIZER(Purple Top)SARS-COV-2 Vaccination 08/10/2019, 08/31/2019, 04/10/2020   Pfizer Covid-19 Vaccine Bivalent Booster 36yrs & up 06/03/2021     Objective: Vital Signs: BP 108/73 (BP Location: Left Arm, Patient Position: Sitting, Cuff Size: Normal)   Pulse 70   Resp 14   Ht 5\' 1"  (1.549 m)  Wt 119 lb (54 kg)   LMP 02/26/2014   BMI 22.48 kg/m    Physical Exam Vitals and nursing note reviewed.  Constitutional:      Appearance: She is well-developed.  HENT:     Head: Normocephalic and atraumatic.  Eyes:     Conjunctiva/sclera: Conjunctivae normal.  Cardiovascular:     Rate and Rhythm: Normal rate and regular rhythm.     Heart sounds: Normal heart sounds.  Pulmonary:     Effort: Pulmonary effort is normal.     Breath  sounds: Normal breath sounds.  Abdominal:     General: Bowel sounds are normal.     Palpations: Abdomen is soft.  Musculoskeletal:     Cervical back: Normal range of motion.  Lymphadenopathy:     Cervical: No cervical adenopathy.  Skin:    General: Skin is warm and dry.     Capillary Refill: Capillary refill takes less than 2 seconds.  Neurological:     Mental Status: She is alert and oriented to person, place, and time.  Psychiatric:        Behavior: Behavior normal.      Musculoskeletal Exam: Cervical, thoracic and lumbar spine were in good range of motion.  Shoulder joints, elbow joints, wrist joints, MCPs PIPs and DIPs were in good range of motion with no synovitis.  Hip joints and knee joints were in good range of motion without any warmth swelling or effusion.  There was no tenderness over ankles or MTPs.  CDAI Exam: CDAI Score: -- Patient Global: 1 mm; Provider Global: 1 mm Swollen: --; Tender: -- Joint Exam 09/15/2022   No joint exam has been documented for this visit   There is currently no information documented on the homunculus. Go to the Rheumatology activity and complete the homunculus joint exam.  Investigation: No additional findings.  Imaging: No results found.  Recent Labs: Lab Results  Component Value Date   WBC 4.8 04/14/2022   HGB 13.0 04/14/2022   PLT 231 04/14/2022   NA 142 04/14/2022   K 4.4 04/14/2022   CL 104 04/14/2022   CO2 31 04/14/2022   GLUCOSE 76 04/14/2022   BUN 20 04/14/2022   CREATININE 1.15 (H) 04/14/2022   BILITOT 0.4 04/14/2022   AST 20 04/14/2022   ALT 16 04/14/2022   PROT 6.6 04/14/2022   CALCIUM 9.4 04/14/2022   GFRAA 114 07/29/2020   QFTBGOLDPLUS NEGATIVE 05/02/2019    Speciality Comments: PLQ Eye Exam: 01/17/2022 Normal @ [facility] follow up in 6  months PLQ started in November 2020  Procedures:  No procedures performed Allergies: Patient has no known allergies.   Assessment / Plan:     Visit  Diagnoses: Rheumatoid arthritis involving multiple sites with positive rheumatoid factor - 04/22/19: ANA-, sed rate 75, RF 62.5: She had recent episode of discomfort in her left thumb base and also left knee.  No synovitis was noted on the examination.  Patient believes that she had some inflammation in the tendon which resolved.  There is no history of joint swelling.  She has been taking hydroxychloroquine 200 mg p.o. twice daily Monday to Friday without any interruption.  High risk medication use - Plaquenil 200 mg 1 tablet by mouth twice daily M-F (started at end of November 2020). PLQ Eye Exam: 01/17/2022.  Last labs in November 2023 showed CBC was normal creatinine was elevated at 1.15.  LFTs were normal.  Will check labs today.  Information on immunization was placed  in the AVS.  Primary insomnia-good sleep gene was discussed.  Vitamin D deficiency-vitamin D was normal on Nov 10, 2021 at 45.  Osteopenia of multiple sites - Updated DEXA 08/11/2021 LFN T score -1.4, BMD 0.850 no comparison.  Lumbar spine- 0.4, BMD 1.139, no comparison, left forearm T score -0.0 no comparison.  Calcium rich diet and vitamin D was discussed.  She will have repeat DEXA scan in March 2025.  History of squamous cell carcinoma - Biopsy-proven -Dr. Acquanetta Chain 2023.  Use of sunscreen was advised.  She will be seeing Dr. Ronnald Ramp this year.  Orders: Orders Placed This Encounter  Procedures   CBC with Differential/Platelet   COMPLETE METABOLIC PANEL WITH GFR   No orders of the defined types were placed in this encounter.    Follow-Up Instructions: Return in about 5 months (around 02/15/2023) for Rheumatoid arthritis.   Bo Merino, MD  Note - This record has been created using Editor, commissioning.  Chart creation errors have been sought, but may not always  have been located. Such creation errors do not reflect on  the standard of medical care.

## 2022-09-15 ENCOUNTER — Ambulatory Visit: Payer: BC Managed Care – PPO | Attending: Rheumatology | Admitting: Rheumatology

## 2022-09-15 ENCOUNTER — Encounter: Payer: Self-pay | Admitting: Rheumatology

## 2022-09-15 VITALS — BP 108/73 | HR 70 | Resp 14 | Ht 61.0 in | Wt 119.0 lb

## 2022-09-15 DIAGNOSIS — Z79899 Other long term (current) drug therapy: Secondary | ICD-10-CM

## 2022-09-15 DIAGNOSIS — F5101 Primary insomnia: Secondary | ICD-10-CM | POA: Diagnosis not present

## 2022-09-15 DIAGNOSIS — M8589 Other specified disorders of bone density and structure, multiple sites: Secondary | ICD-10-CM

## 2022-09-15 DIAGNOSIS — Z8589 Personal history of malignant neoplasm of other organs and systems: Secondary | ICD-10-CM

## 2022-09-15 DIAGNOSIS — E559 Vitamin D deficiency, unspecified: Secondary | ICD-10-CM

## 2022-09-15 DIAGNOSIS — M0579 Rheumatoid arthritis with rheumatoid factor of multiple sites without organ or systems involvement: Secondary | ICD-10-CM | POA: Diagnosis not present

## 2022-09-15 NOTE — Patient Instructions (Signed)
Vaccines You are taking a medication(s) that can suppress your immune system.  The following immunizations are recommended: Flu annually Covid-19  Td/Tdap (tetanus, diphtheria, pertussis) every 10 years Pneumonia (Prevnar 15 then Pneumovax 23 at least 1 year apart.  Alternatively, can take Prevnar 20 without needing additional dose) Shingrix: 2 doses from 4 weeks to 6 months apart  Please check with your PCP to make sure you are up to date.  

## 2022-09-16 LAB — CBC WITH DIFFERENTIAL/PLATELET
Absolute Monocytes: 405 cells/uL (ref 200–950)
Basophils Absolute: 41 cells/uL (ref 0–200)
Basophils Relative: 0.9 %
Eosinophils Absolute: 69 cells/uL (ref 15–500)
Eosinophils Relative: 1.5 %
HCT: 36.4 % (ref 35.0–45.0)
Hemoglobin: 12.1 g/dL (ref 11.7–15.5)
Lymphs Abs: 1416.8 cells/uL (ref 850–3900)
MCH: 31.7 pg (ref 27.0–33.0)
MCHC: 33.2 g/dL (ref 32.0–36.0)
MCV: 95.3 fL (ref 80.0–100.0)
MPV: 10.2 fL (ref 7.5–12.5)
Monocytes Relative: 8.8 %
Neutro Abs: 2668 cells/uL (ref 1500–7800)
Neutrophils Relative %: 58 %
Platelets: 219 10*3/uL (ref 140–400)
RBC: 3.82 10*6/uL (ref 3.80–5.10)
RDW: 12.2 % (ref 11.0–15.0)
Total Lymphocyte: 30.8 %
WBC: 4.6 10*3/uL (ref 3.8–10.8)

## 2022-09-16 LAB — COMPLETE METABOLIC PANEL WITH GFR
AG Ratio: 1.9 (calc) (ref 1.0–2.5)
ALT: 19 U/L (ref 6–29)
AST: 23 U/L (ref 10–35)
Albumin: 4.2 g/dL (ref 3.6–5.1)
Alkaline phosphatase (APISO): 53 U/L (ref 37–153)
BUN: 25 mg/dL (ref 7–25)
CO2: 28 mmol/L (ref 20–32)
Calcium: 9.2 mg/dL (ref 8.6–10.4)
Chloride: 104 mmol/L (ref 98–110)
Creat: 0.6 mg/dL (ref 0.50–1.03)
Globulin: 2.2 g/dL (calc) (ref 1.9–3.7)
Glucose, Bld: 83 mg/dL (ref 65–99)
Potassium: 4.5 mmol/L (ref 3.5–5.3)
Sodium: 140 mmol/L (ref 135–146)
Total Bilirubin: 0.3 mg/dL (ref 0.2–1.2)
Total Protein: 6.4 g/dL (ref 6.1–8.1)
eGFR: 103 mL/min/{1.73_m2} (ref >=60)

## 2022-09-16 NOTE — Progress Notes (Signed)
CBC and CMP are normal.

## 2022-11-05 ENCOUNTER — Other Ambulatory Visit: Payer: Self-pay | Admitting: Physician Assistant

## 2022-11-05 DIAGNOSIS — M0579 Rheumatoid arthritis with rheumatoid factor of multiple sites without organ or systems involvement: Secondary | ICD-10-CM

## 2022-11-08 NOTE — Telephone Encounter (Signed)
Last Fill: 09/06/2022  Eye exam: 01/17/2022   Labs: 09/15/2022 CBC and CMP are normal.   Next Visit: 03/02/2023  Last Visit: 09/15/2022  ON:[id number] arthritis involving multiple sites with positive rheumatoid factor   Current Dose per office note on 09/15/2022: Plaquenil 200 mg 1 tablet by mouth twice daily M-F   Okay to refill Plaquenil?

## 2023-02-08 ENCOUNTER — Other Ambulatory Visit: Payer: Self-pay | Admitting: Physician Assistant

## 2023-02-08 DIAGNOSIS — M0579 Rheumatoid arthritis with rheumatoid factor of multiple sites without organ or systems involvement: Secondary | ICD-10-CM

## 2023-02-08 NOTE — Telephone Encounter (Signed)
Last Fill: 11/08/2022  Eye exam:  01/18/2023 Normal    Labs: 09/15/2022 CBC and CMP are normal.   Next Visit: 03/02/2023   Last Visit: 09/15/2022  [id number] arthritis involving multiple sites with positive rheumatoid factor   Current Dose per office note 09/15/2022: Plaquenil 200 mg 1 tablet by mouth twice daily M-F   Okay to refill Plaquenil?

## 2023-02-21 NOTE — Progress Notes (Signed)
Office Visit Note  Patient: Carmen Christensen             Date of Birth: [birth year]           MRN: [id number]             PCP: Tracey Harries, MD Referring: Tracey Harries, MD Visit Date: 03/02/2023 Occupation: @GUAROCC @  Subjective:  Medication management  History of Present Illness: Carmen Christensen is a 62 y.o. female with seropositive rheumatoid arthritis.  She states she has been taking hydroxychloroquine 200 mg p.o. twice daily Monday to Friday without any interruption.  She had eye examination in August 2024 which was normal.  She has not had a flare of rheumatoid arthritis.  She denies any joint swelling or inflammation.  She states she developed COVID-19 virus infection in mid July.  She did not take Paxlovid and did not develop any complications from COVID-19.  She has not been taking vitamin D on a regular basis.    Activities of Daily Living:  Patient reports morning stiffness for a few minutes.   Patient Reports nocturnal pain.  Difficulty dressing/grooming: Denies Difficulty climbing stairs: Denies Difficulty getting out of chair: Denies Difficulty using hands for taps, buttons, cutlery, and/or writing: Reports  Review of Systems  Constitutional:  Positive for fatigue.  HENT:  Negative for mouth sores and mouth dryness.   Eyes:  Positive for dryness.  Respiratory:  Negative for shortness of breath.   Cardiovascular:  Negative for chest pain and palpitations.  Gastrointestinal:  Negative for blood in stool, constipation and diarrhea.  Endocrine: Negative for increased urination.  Genitourinary:  Negative for involuntary urination.  Musculoskeletal:  Positive for joint pain, joint pain and morning stiffness. Negative for gait problem, joint swelling, myalgias, muscle weakness, muscle tenderness and myalgias.  Skin:  Positive for sensitivity to sunlight. Negative for color change, rash and hair loss.  Allergic/Immunologic: Negative for susceptible to infections.   Neurological:  Negative for dizziness and headaches.  Hematological:  Negative for swollen glands.  Psychiatric/Behavioral:  Positive for sleep disturbance. Negative for depressed mood. The patient is nervous/anxious.     PMFS History:  Patient Active Problem List   Diagnosis Date Noted   Rheumatoid arthritis involving multiple sites with positive rheumatoid factor (HCC) 02/26/2020   High risk medication use 02/26/2020   Primary insomnia 02/26/2020   Plantar fasciitis, left 02/26/2020   Osteopenia of multiple sites 02/26/2020    Past Medical History:  Diagnosis Date   Rheumatoid arthritis (HCC)     Family History  Problem Relation Age of Onset   Cataracts Father    Heart Problems Father    Colon cancer Paternal Grandfather        not sure age of onset   Healthy Son    Healthy Son    Past Surgical History:  Procedure Laterality Date   DILATION AND CURETTAGE OF UTERUS  06/13/1998   NASAL SINUS SURGERY  06/14/1991   SQUAMOUS CELL CARCINOMA EXCISION Right 03/2022   leg   Social History   Social History Narrative   Not on file   Immunization History  Administered Date(s) Administered   COVID-19, mRNA, vaccine(Comirnaty)12 years and older 06/10/2022   PFIZER(Purple Top)SARS-COV-2 Vaccination 08/10/2019, 08/31/2019, 04/10/2020   Pfizer Covid-19 Vaccine Bivalent Booster 61yrs & up 06/03/2021     Objective: Vital Signs: BP 124/77 (BP Location: Left Arm, Patient Position: Sitting, Cuff Size: Normal)   Pulse 70   Resp 13   Ht 5\' 1"  (  1.549 m)   Wt 118 lb 3.2 oz (53.6 kg)   LMP 02/26/2014   BMI 22.33 kg/m    Physical Exam Vitals and nursing note reviewed.  Constitutional:      Appearance: She is well-developed.  HENT:     Head: Normocephalic and atraumatic.  Eyes:     Conjunctiva/sclera: Conjunctivae normal.  Cardiovascular:     Rate and Rhythm: Normal rate and regular rhythm.     Heart sounds: Normal heart sounds.  Pulmonary:     Effort: Pulmonary effort is  normal.     Breath sounds: Normal breath sounds.  Abdominal:     General: Bowel sounds are normal.     Palpations: Abdomen is soft.  Musculoskeletal:     Cervical back: Normal range of motion.  Lymphadenopathy:     Cervical: No cervical adenopathy.  Skin:    General: Skin is warm and dry.     Capillary Refill: Capillary refill takes less than 2 seconds.  Neurological:     Mental Status: She is alert and oriented to person, place, and time.  Psychiatric:        Behavior: Behavior normal.      Musculoskeletal Exam: Cervical, thoracic and lumbar spine 1 good range of motion.  Shoulder joints, elbow joints, wrist joints, MCPs PIPs and DIPs were in good range of motion with no synovitis.  Hip joints and knee joints in good range of motion without any warmth swelling or effusion.  There was no tenderness over ankles or MTPs.  CDAI Exam: CDAI Score: -- Patient Global: 0 / 100; Provider Global: 0 / 100 Swollen: --; Tender: -- Joint Exam 03/02/2023   No joint exam has been documented for this visit   There is currently no information documented on the homunculus. Go to the Rheumatology activity and complete the homunculus joint exam.  Investigation: No additional findings.  Imaging: No results found.  Recent Labs: Lab Results  Component Value Date   WBC 4.6 09/15/2022   HGB 12.1 09/15/2022   PLT 219 09/15/2022   NA 140 09/15/2022   K 4.5 09/15/2022   CL 104 09/15/2022   CO2 28 09/15/2022   GLUCOSE 83 09/15/2022   BUN 25 09/15/2022   CREATININE 0.60 09/15/2022   BILITOT 0.3 09/15/2022   AST 23 09/15/2022   ALT 19 09/15/2022   PROT 6.4 09/15/2022   CALCIUM 9.2 09/15/2022   GFRAA 114 07/29/2020   QFTBGOLDPLUS NEGATIVE 05/02/2019    Speciality Comments: PLQ Eye Exam: 01/18/2023 Normal @ [facility] follow up in 6  months PLQ started in November 2020  Procedures:  No procedures performed Allergies: Patient has no known allergies.   Assessment / Plan:      Visit Diagnoses: Rheumatoid arthritis involving multiple sites with positive rheumatoid factor (HCC) - 04/22/19: ANA-, sed rate 75, RF 62.5: Patient had no synovitis on the examination.  No warmth swelling or effusion was noted.  She denies any episodes of rheumatoid arthritis flares.  She has been taking hydroxychloroquine on a regular basis.  High risk medication use - Plaquenil 200 mg 1 tablet by mouth twice daily M-F (started at end of November 2020). PLQ Eye Exam: 01/18/2023 -Labs obtained in April 2024 CBC and CMP were normal.  Will get labs today.  She was advised to get labs every 5 months.  Ocular examination to be annually.  Information about immunization was placed in the AVS.  Plan: CBC with Differential/Platelet, COMPLETE METABOLIC PANEL WITH GFR  Primary insomnia-improved.  Vitamin D deficiency -she has missed doses of vitamin D recently.  Plan: VITAMIN D 25 Hydroxy (Vit-D Deficiency, Fractures)  Osteopenia of multiple sites - Updated DEXA 08/11/2021 LFN T score -1.4, BMD 0.850 no comparison.  Lumbar spine- 0.4, BMD 1.139, no comparison, left forearm T score -0.0 no comparison.  She was advised to get repeat DEXA scan after March 2025.  Calcium rich diet and regular exercise was emphasized.  History of squamous cell carcinoma - Biopsy-proven -she is followed by Dr. Larose Kells appointment was October 2023.  COVID-19 virus infection-patient developed COVID-19 virus infection in July.  She did not develop any complications from COVID-19 virus infection.  She had cough for few days and then resolved.  Orders: Orders Placed This Encounter  Procedures   CBC with Differential/Platelet   COMPLETE METABOLIC PANEL WITH GFR   VITAMIN D 25 Hydroxy (Vit-D Deficiency, Fractures)   No orders of the defined types were placed in this encounter.    Follow-Up Instructions: Return in about 5 months (around 08/02/2023) for Rheumatoid arthritis.   Pollyann Savoy, MD  Note - This record has  been created using Animal nutritionist.  Chart creation errors have been sought, but may not always  have been located. Such creation errors do not reflect on  the standard of medical care.

## 2023-03-02 ENCOUNTER — Ambulatory Visit: Payer: BC Managed Care – PPO | Attending: Rheumatology | Admitting: Rheumatology

## 2023-03-02 ENCOUNTER — Encounter: Payer: Self-pay | Admitting: Rheumatology

## 2023-03-02 VITALS — BP 124/77 | HR 70 | Resp 13 | Ht 61.0 in | Wt 118.2 lb

## 2023-03-02 DIAGNOSIS — E559 Vitamin D deficiency, unspecified: Secondary | ICD-10-CM

## 2023-03-02 DIAGNOSIS — M0579 Rheumatoid arthritis with rheumatoid factor of multiple sites without organ or systems involvement: Secondary | ICD-10-CM

## 2023-03-02 DIAGNOSIS — F5101 Primary insomnia: Secondary | ICD-10-CM | POA: Diagnosis not present

## 2023-03-02 DIAGNOSIS — U071 COVID-19: Secondary | ICD-10-CM

## 2023-03-02 DIAGNOSIS — Z79899 Other long term (current) drug therapy: Secondary | ICD-10-CM | POA: Diagnosis not present

## 2023-03-02 DIAGNOSIS — M8589 Other specified disorders of bone density and structure, multiple sites: Secondary | ICD-10-CM

## 2023-03-02 DIAGNOSIS — Z8589 Personal history of malignant neoplasm of other organs and systems: Secondary | ICD-10-CM

## 2023-03-02 NOTE — Patient Instructions (Signed)

## 2023-03-03 LAB — COMPLETE METABOLIC PANEL WITH GFR
AG Ratio: 2.1 (calc) (ref 1.0–2.5)
ALT: 13 U/L (ref 6–29)
AST: 20 U/L (ref 10–35)
Albumin: 4.6 g/dL (ref 3.6–5.1)
Alkaline phosphatase (APISO): 53 U/L (ref 37–153)
BUN/Creatinine Ratio: 39 (calc) — ABNORMAL HIGH (ref 6–22)
BUN: 27 mg/dL — ABNORMAL HIGH (ref 7–25)
CO2: 29 mmol/L (ref 20–32)
Calcium: 9.3 mg/dL (ref 8.6–10.4)
Chloride: 102 mmol/L (ref 98–110)
Creat: 0.69 mg/dL (ref 0.50–1.05)
Globulin: 2.2 g/dL (calc) (ref 1.9–3.7)
Glucose, Bld: 85 mg/dL (ref 65–99)
Potassium: 4.3 mmol/L (ref 3.5–5.3)
Sodium: 139 mmol/L (ref 135–146)
Total Bilirubin: 0.4 mg/dL (ref 0.2–1.2)
Total Protein: 6.8 g/dL (ref 6.1–8.1)
eGFR: 99 mL/min/{1.73_m2} (ref >=60)

## 2023-03-03 LAB — CBC WITH DIFFERENTIAL/PLATELET
Absolute Monocytes: 446 cells/uL (ref 200–950)
Basophils Absolute: 22 cells/uL (ref 0–200)
Basophils Relative: 0.4 %
Eosinophils Absolute: 61 cells/uL (ref 15–500)
Eosinophils Relative: 1.1 %
HCT: 37.9 % (ref 35.0–45.0)
Hemoglobin: 12.6 g/dL (ref 11.7–15.5)
Lymphs Abs: 1381 cells/uL (ref 850–3900)
MCH: 32 pg (ref 27.0–33.0)
MCHC: 33.2 g/dL (ref 32.0–36.0)
MCV: 96.2 fL (ref 80.0–100.0)
MPV: 10.4 fL (ref 7.5–12.5)
Monocytes Relative: 8.1 %
Neutro Abs: 3592 cells/uL (ref 1500–7800)
Neutrophils Relative %: 65.3 %
Platelets: 207 10*3/uL (ref 140–400)
RBC: 3.94 10*6/uL (ref 3.80–5.10)
RDW: 11.8 % (ref 11.0–15.0)
Total Lymphocyte: 25.1 %
WBC: 5.5 10*3/uL (ref 3.8–10.8)

## 2023-03-03 LAB — VITAMIN D 25 HYDROXY (VIT D DEFICIENCY, FRACTURES): Vit D, 25-Hydroxy: 39 ng/mL (ref 30–100)

## 2023-03-03 NOTE — Progress Notes (Signed)
CBC, CMP and vitamin D are within normal limits vitamin D is 39.  Patient should continue to take vitamin D

## 2023-03-03 NOTE — Progress Notes (Signed)
BUN and creatinine ratio is mildly elevated and not a concern.  Her creatinine is normal.

## 2023-05-12 ENCOUNTER — Other Ambulatory Visit: Payer: Self-pay | Admitting: Physician Assistant

## 2023-05-12 DIAGNOSIS — M0579 Rheumatoid arthritis with rheumatoid factor of multiple sites without organ or systems involvement: Secondary | ICD-10-CM

## 2023-05-15 NOTE — Telephone Encounter (Signed)
Last Fill: 02/08/2023  Eye exam: 8/87/2024   Labs: 03/02/2023 BUN and creatinine ratio is mildly elevated and not a concern.  Her creatinine is normal.  CBC, CMP and vitamin D are within normal limits vitamin D is 39.  Patient should continue to take vitamin D   Next Visit: 08/17/2022  Last Visit: 03/02/2023,   DX: Rheumatoid arthritis involving multiple sites with positive rheumatoid factor   Current Dose per office note 03/02/2023: Plaquenil 200 mg 1 tablet by mouth twice daily M-F   Okay to refill Plaquenil?

## 2023-08-03 NOTE — Progress Notes (Signed)
 Office Visit Note  Patient: Carmen Christensen             Date of Birth: [birth year]           MRN: [id number]             PCP: Tracey Harries, MD Referring: Tracey Harries, MD Visit Date: 08/17/2023 Occupation: @GUAROCC @  Subjective:  Medication management  History of Present Illness: Carmen Christensen is a 63 y.o. female with seropositive rheumatoid arthritis.  She denies having a flare of rheumatoid arthritis since the last visit.  She has been taking hydroxychloroquine 200 mg twice daily Monday to Friday without any interruption.  She denies any morning stiffness or joint swelling.  She states she has occasional discomfort in her right first MTP joint and between the right second and third MTP joint.  She has not noticed any joint swelling.  She has been under a lot of stress due to the family situation.    Activities of Daily Living:  Patient reports morning stiffness for less than 5 minutes.   Patient Reports nocturnal pain.  Difficulty dressing/grooming: Denies Difficulty climbing stairs: Denies Difficulty getting out of chair: Denies Difficulty using hands for taps, buttons, cutlery, and/or writing: Reports  Review of Systems  Constitutional:  Positive for fatigue.  HENT:  Negative for mouth sores and mouth dryness.   Eyes:  Positive for dryness.  Respiratory:  Negative for shortness of breath.   Cardiovascular:  Negative for chest pain and palpitations.  Gastrointestinal:  Negative for blood in stool, constipation and diarrhea.  Endocrine: Negative for increased urination.  Genitourinary:  Negative for involuntary urination.  Musculoskeletal:  Positive for joint pain, joint pain, muscle weakness and morning stiffness. Negative for gait problem, joint swelling, myalgias, muscle tenderness and myalgias.  Skin:  Positive for sensitivity to sunlight. Negative for color change, rash and hair loss.  Allergic/Immunologic: Negative for susceptible to infections.  Neurological:   Negative for dizziness and headaches.  Hematological:  Negative for swollen glands.  Psychiatric/Behavioral:  Positive for sleep disturbance. Negative for depressed mood. The patient is nervous/anxious.     PMFS History:  Patient Active Problem List   Diagnosis Date Noted   Rheumatoid arthritis involving multiple sites with positive rheumatoid factor (HCC) 02/26/2020   High risk medication use 02/26/2020   Primary insomnia 02/26/2020   Plantar fasciitis, left 02/26/2020   Osteopenia of multiple sites 02/26/2020    Past Medical History:  Diagnosis Date   Rheumatoid arthritis (HCC)     Family History  Problem Relation Age of Onset   Cataracts Father    Heart Problems Father    Colon cancer Paternal Grandfather        not sure age of onset   Healthy Son    Healthy Son    Past Surgical History:  Procedure Laterality Date   DILATION AND CURETTAGE OF UTERUS  06/13/1998   NASAL SINUS SURGERY  06/14/1991   SQUAMOUS CELL CARCINOMA EXCISION Right 03/2022   leg   Social History   Social History Narrative   Not on file   Immunization History  Administered Date(s) Administered   PFIZER(Purple Top)SARS-COV-2 Vaccination 08/10/2019, 08/31/2019, 04/10/2020   Pfizer Covid-19 Vaccine Bivalent Booster 2yrs & up 06/03/2021   Pfizer(Comirnaty)Fall Seasonal Vaccine 12 years and older 06/10/2022, 06/12/2023     Objective: Vital Signs: BP 112/69 (BP Location: Left Arm, Patient Position: Sitting, Cuff Size: Normal)   Pulse 72   Resp 14   Ht 5\' 1"  (  1.549 m)   Wt 121 lb (54.9 kg)   LMP 02/26/2014   BMI 22.86 kg/m    Physical Exam Vitals and nursing note reviewed.  Constitutional:      Appearance: She is well-developed.  HENT:     Head: Normocephalic and atraumatic.  Eyes:     Conjunctiva/sclera: Conjunctivae normal.  Cardiovascular:     Rate and Rhythm: Normal rate and regular rhythm.     Heart sounds: Normal heart sounds.  Pulmonary:     Effort: Pulmonary effort is normal.      Breath sounds: Normal breath sounds.  Abdominal:     General: Bowel sounds are normal.     Palpations: Abdomen is soft.  Musculoskeletal:     Cervical back: Normal range of motion.  Lymphadenopathy:     Cervical: No cervical adenopathy.  Skin:    General: Skin is warm and dry.     Capillary Refill: Capillary refill takes less than 2 seconds.  Neurological:     Mental Status: She is alert and oriented to person, place, and time.  Psychiatric:        Behavior: Behavior normal.      Musculoskeletal Exam: Cervical, thoracic and lumbar spine 1 good range of motion.  Shoulders, elbows, wrist joints, MCPs PIPs and DIPs Juengel range of motion with no synovitis.  Hip joints and knee joints in good range of motion.  She had mild tenderness between the right second and third toe.  CDAI Exam: CDAI Score: -- Patient Global: --; Provider Global: -- Swollen: --; Tender: -- Joint Exam 08/17/2023   No joint exam has been documented for this visit   There is currently no information documented on the homunculus. Go to the Rheumatology activity and complete the homunculus joint exam.  Investigation: No additional findings.  Imaging: No results found.  Recent Labs: Lab Results  Component Value Date   WBC 5.5 03/02/2023   HGB 12.6 03/02/2023   PLT 207 03/02/2023   NA 139 03/02/2023   K 4.3 03/02/2023   CL 102 03/02/2023   CO2 29 03/02/2023   GLUCOSE 85 03/02/2023   BUN 27 (H) 03/02/2023   CREATININE 0.69 03/02/2023   BILITOT 0.4 03/02/2023   AST 20 03/02/2023   ALT 13 03/02/2023   PROT 6.8 03/02/2023   CALCIUM 9.3 03/02/2023   GFRAA 114 07/29/2020   QFTBGOLDPLUS NEGATIVE 05/02/2019    Speciality Comments: PLQ Eye Exam: 01/18/2023 Normal @ [facility] follow up in 6  months PLQ started in November 2020  December 2024 labs in [facility] lipid panel LDL 141, HDL 98  Procedures:  No procedures performed Allergies: Patient has no known allergies.   Assessment /  Plan:     Visit Diagnoses: Rheumatoid arthritis involving multiple sites with positive rheumatoid factor (HCC) - 04/22/19: ANA-, sed rate 75, RF 62.5: Patient denies having a flare of rheumatoid arthritis.  No synovitis was noted on the examination.  She had occasional discomfort in the right first MTP joint and between right second and third MTP joint.  High risk medication use - Plaquenil 200 mg 1 tablet by mouth twice daily M-F (started at end of November 2020). PLQ Eye Exam: 01/18/2023 - Plan: CBC with Differential/Platelet, COMPLETE METABOLIC PANEL WITH GFR today.  Pain in right foot-she complains of discomfort over the right first MTP joint and between second and third MTP joint intermittently.  She has not noticed any joint swelling.  No synovitis was noted.  Possibility of Morton's  neuroma was discussed.) fitting shoes were advised.  I advised her to contact us if the symptoms persist.  Dyslipidemia-LDL and HDL both are elevated.  She has been followed closely by Dr. Everlene Other.  Primary insomnia-she has intermittent insomnia.  Vitamin D deficiency-vitamin D was normal at 38 on March 02, 2023.  Osteopenia of multiple sites - DEXA 08/11/2021 LFN T score -1.4, BMD 0.850 no comparison.  Lumbar spine- 0.4, BMD 1.139, no comparison, left forearm T score -0.0 no comparison.  Patient will have repeat DEXA scan this year.  Need for regular exercise and stretching was discussed.  History of squamous cell carcinoma - Biopsy-proven -she is followed by Dr. Larose Kells appointment was October 2023.  Verne Spurr has been under a lot of stress due to her parents decline in health.  Orders: Orders Placed This Encounter  Procedures   CBC with Differential/Platelet   COMPLETE METABOLIC PANEL WITH GFR   No orders of the defined types were placed in this encounter.    Follow-Up Instructions: Return in about 5 months (around 01/17/2024) for Rheumatoid arthritis.   Pollyann Savoy, MD  Note - This  record has been created using Animal nutritionist.  Chart creation errors have been sought, but may not always  have been located. Such creation errors do not reflect on  the standard of medical care.

## 2023-08-17 ENCOUNTER — Encounter: Payer: Self-pay | Admitting: Rheumatology

## 2023-08-17 ENCOUNTER — Ambulatory Visit: Payer: BC Managed Care – PPO | Attending: Rheumatology | Admitting: Rheumatology

## 2023-08-17 VITALS — BP 112/69 | HR 72 | Resp 14 | Ht 61.0 in | Wt 121.0 lb

## 2023-08-17 DIAGNOSIS — E559 Vitamin D deficiency, unspecified: Secondary | ICD-10-CM | POA: Diagnosis not present

## 2023-08-17 DIAGNOSIS — F439 Reaction to severe stress, unspecified: Secondary | ICD-10-CM

## 2023-08-17 DIAGNOSIS — F5101 Primary insomnia: Secondary | ICD-10-CM

## 2023-08-17 DIAGNOSIS — M8589 Other specified disorders of bone density and structure, multiple sites: Secondary | ICD-10-CM

## 2023-08-17 DIAGNOSIS — M79671 Pain in right foot: Secondary | ICD-10-CM

## 2023-08-17 DIAGNOSIS — Z79899 Other long term (current) drug therapy: Secondary | ICD-10-CM

## 2023-08-17 DIAGNOSIS — Z8589 Personal history of malignant neoplasm of other organs and systems: Secondary | ICD-10-CM

## 2023-08-17 DIAGNOSIS — M0579 Rheumatoid arthritis with rheumatoid factor of multiple sites without organ or systems involvement: Secondary | ICD-10-CM

## 2023-08-17 DIAGNOSIS — E785 Hyperlipidemia, unspecified: Secondary | ICD-10-CM

## 2023-08-17 DIAGNOSIS — U071 COVID-19: Secondary | ICD-10-CM

## 2023-08-17 NOTE — Patient Instructions (Signed)

## 2023-08-18 LAB — CBC WITH DIFFERENTIAL/PLATELET
Absolute Lymphocytes: 1285 {cells}/uL (ref 850–3900)
Absolute Monocytes: 469 {cells}/uL (ref 200–950)
Basophils Absolute: 31 {cells}/uL (ref 0–200)
Basophils Relative: 0.6 %
Eosinophils Absolute: 102 {cells}/uL (ref 15–500)
Eosinophils Relative: 2 %
HCT: 36.8 % (ref 35.0–45.0)
Hemoglobin: 12.4 g/dL (ref 11.7–15.5)
MCH: 31.4 pg (ref 27.0–33.0)
MCHC: 33.7 g/dL (ref 32.0–36.0)
MCV: 93.2 fL (ref 80.0–100.0)
MPV: 10.6 fL (ref 7.5–12.5)
Monocytes Relative: 9.2 %
Neutro Abs: 3213 {cells}/uL (ref 1500–7800)
Neutrophils Relative %: 63 %
Platelets: 214 10*3/uL (ref 140–400)
RBC: 3.95 10*6/uL (ref 3.80–5.10)
RDW: 11.9 % (ref 11.0–15.0)
Total Lymphocyte: 25.2 %
WBC: 5.1 10*3/uL (ref 3.8–10.8)

## 2023-08-18 LAB — COMPLETE METABOLIC PANEL WITH GFR
AG Ratio: 2.1 (calc) (ref 1.0–2.5)
ALT: 16 U/L (ref 6–29)
AST: 21 U/L (ref 10–35)
Albumin: 4.5 g/dL (ref 3.6–5.1)
Alkaline phosphatase (APISO): 54 U/L (ref 37–153)
BUN: 19 mg/dL (ref 7–25)
CO2: 30 mmol/L (ref 20–32)
Calcium: 9.6 mg/dL (ref 8.6–10.4)
Chloride: 102 mmol/L (ref 98–110)
Creat: 0.7 mg/dL (ref 0.50–1.05)
Globulin: 2.1 g/dL (ref 1.9–3.7)
Glucose, Bld: 83 mg/dL (ref 65–99)
Potassium: 4.4 mmol/L (ref 3.5–5.3)
Sodium: 138 mmol/L (ref 135–146)
Total Bilirubin: 0.4 mg/dL (ref 0.2–1.2)
Total Protein: 6.6 g/dL (ref 6.1–8.1)
eGFR: 99 mL/min/{1.73_m2} (ref >=60)

## 2023-08-18 NOTE — Progress Notes (Signed)
 CBC and CMP are normal.

## 2023-08-21 ENCOUNTER — Other Ambulatory Visit: Payer: Self-pay | Admitting: Physician Assistant

## 2023-08-21 DIAGNOSIS — M0579 Rheumatoid arthritis with rheumatoid factor of multiple sites without organ or systems involvement: Secondary | ICD-10-CM

## 2023-08-21 NOTE — Telephone Encounter (Signed)
 Last Fill: 05/15/2023  Eye exam: 01/18/2023 Normal   Labs: 08/17/2023 CBC and CMP are normal.   Next Visit: 01/17/2024  Last Visit: 08/17/2023  [id number] arthritis involving multiple sites with positive rheumatoid factor (HCC)   Current Dose per office note 08/17/2023: Plaquenil 200 mg 1 tablet by mouth twice daily M-F   Okay to refill Plaquenil?

## 2023-11-29 ENCOUNTER — Other Ambulatory Visit: Payer: Self-pay | Admitting: Physician Assistant

## 2023-11-29 DIAGNOSIS — M0579 Rheumatoid arthritis with rheumatoid factor of multiple sites without organ or systems involvement: Secondary | ICD-10-CM

## 2023-11-29 NOTE — Telephone Encounter (Signed)
 Last Fill: 08/21/2023  Eye exam: 01/18/2023 Normal    Labs: 08/17/2023 CBC and CMP are normal.   Next Visit: 01/17/2024  Last Visit: 08/17/2023  EA:[id number] arthritis involving multiple sites with positive rheumatoid factor   Current Dose per office note 08/17/2023: Plaquenil  200 mg 1 tablet by mouth twice daily M-F   Okay to refill Plaquenil ?

## 2024-01-03 NOTE — Progress Notes (Signed)
 Office Visit Note  Patient: Carmen Christensen             Date of Birth: [birth year]           MRN: [id number]             PCP: Pura Lenis, MD Referring: Pura Lenis, MD Visit Date: 01/17/2024 Occupation: @GUAROCC @  Subjective:  Medication management  History of Present Illness: Carmen Christensen is a 63 y.o. female with seropositive rheumatoid arthritis.  She denies having a flare of rheumatoid arthritis.  She reports minimal morning stiffness lasting for about 2 to 3 minutes.  She states she has been taking hydroxychloroquine  200 mg twice a day Monday to Friday without any interruption.  She has been walking on a regular basis.  She notices some discomfort in her bunions.    Activities of Daily Living:  Patient reports morning stiffness for 2-3 minutes.   Patient Reports nocturnal pain.  Difficulty dressing/grooming: Denies Difficulty climbing stairs: Denies Difficulty getting out of chair: Denies Difficulty using hands for taps, buttons, cutlery, and/or writing: Reports  Review of Systems  Constitutional:  Positive for fatigue.  HENT:  Positive for mouth dryness. Negative for mouth sores.   Eyes:  Positive for dryness.  Respiratory:  Negative for shortness of breath.   Cardiovascular:  Negative for chest pain and palpitations.  Gastrointestinal:  Negative for blood in stool, constipation and diarrhea.  Endocrine: Negative for increased urination.  Genitourinary:  Negative for involuntary urination.  Musculoskeletal:  Positive for morning stiffness. Negative for joint pain, gait problem, joint pain, joint swelling, myalgias, muscle weakness, muscle tenderness and myalgias.  Skin:  Positive for sensitivity to sunlight. Negative for color change, rash and hair loss.  Allergic/Immunologic: Negative for susceptible to infections.  Neurological:  Negative for dizziness and headaches.  Hematological:  Negative for swollen glands.  Psychiatric/Behavioral:  Positive for sleep  disturbance. Negative for depressed mood. The patient is not nervous/anxious.     PMFS History:  Patient Active Problem List   Diagnosis Date Noted   Rheumatoid arthritis involving multiple sites with positive rheumatoid factor (HCC) 02/26/2020   High risk medication use 02/26/2020   Primary insomnia 02/26/2020   Plantar fasciitis, left 02/26/2020   Osteopenia of multiple sites 02/26/2020    Past Medical History:  Diagnosis Date   Rheumatoid arthritis (HCC)     Family History  Problem Relation Age of Onset   Cataracts Father    Heart Problems Father    Colon cancer Paternal Grandfather        not sure age of onset   Healthy Son    Healthy Son    Past Surgical History:  Procedure Laterality Date   DILATION AND CURETTAGE OF UTERUS  06/13/1998   NASAL SINUS SURGERY  06/14/1991   SQUAMOUS CELL CARCINOMA EXCISION Right 03/2022   leg   Social History   Social History Narrative   Not on file   Immunization History  Administered Date(s) Administered   PFIZER(Purple Top)SARS-COV-2 Vaccination 08/10/2019, 08/31/2019, 04/10/2020   Pfizer Covid-19 Vaccine Bivalent Booster 1yrs & up 06/03/2021   Pfizer(Comirnaty)Fall Seasonal Vaccine 12 years and older 06/10/2022, 06/12/2023     Objective: Vital Signs: BP 134/81 (BP Location: Left Arm, Patient Position: Sitting, Cuff Size: Normal)   Pulse 79   Resp 14   Ht 5' 1 (1.549 m)   Wt 113 lb 3.2 oz (51.3 kg)   LMP 02/26/2014   BMI 21.39 kg/m    Physical Exam  Vitals and nursing note reviewed.  Constitutional:      Appearance: She is well-developed.  HENT:     Head: Normocephalic and atraumatic.  Eyes:     Conjunctiva/sclera: Conjunctivae normal.  Cardiovascular:     Rate and Rhythm: Normal rate and regular rhythm.     Heart sounds: Normal heart sounds.  Pulmonary:     Effort: Pulmonary effort is normal.     Breath sounds: Normal breath sounds.  Abdominal:     General: Bowel sounds are normal.     Palpations: Abdomen  is soft.  Musculoskeletal:     Cervical back: Normal range of motion.  Lymphadenopathy:     Cervical: No cervical adenopathy.  Skin:    General: Skin is warm and dry.     Capillary Refill: Capillary refill takes less than 2 seconds.  Neurological:     Mental Status: She is alert and oriented to person, place, and time.  Psychiatric:        Behavior: Behavior normal.      Musculoskeletal Exam: Cervical, thoracic and lumbar spine were in good range of motion.  She had no difficulty reaching her toes.  Shoulders, elbows, wrist joints, MCPs PIPs and DIPs were in good range of motion without any synovitis.  Hip joints and knee joints in good range of motion.  No warmth swelling or effusion was noted.  She has prominence of bunions bilaterally without any tenderness.  CDAI Exam: CDAI Score: -- Patient Global: --; Provider Global: -- Swollen: --; Tender: -- Joint Exam 01/17/2024   No joint exam has been documented for this visit   There is currently no information documented on the homunculus. Go to the Rheumatology activity and complete the homunculus joint exam.  Investigation: No additional findings.  Imaging: No results found.  Recent Labs: Lab Results  Component Value Date   WBC 5.1 08/17/2023   HGB 12.4 08/17/2023   PLT 214 08/17/2023   NA 138 08/17/2023   K 4.4 08/17/2023   CL 102 08/17/2023   CO2 30 08/17/2023   GLUCOSE 83 08/17/2023   BUN 19 08/17/2023   CREATININE 0.70 08/17/2023   BILITOT 0.4 08/17/2023   AST 21 08/17/2023   ALT 16 08/17/2023   PROT 6.6 08/17/2023   CALCIUM 9.6 08/17/2023   GFRAA 114 07/29/2020   QFTBGOLDPLUS NEGATIVE 05/02/2019      Speciality Comments: PLQ Eye Exam: 01/18/2023 Normal @ [facility] follow up in 6  months. PLQ eye exam scheduled for September 2025 per patient.  PLQ started in November 2020  Procedures:  No procedures performed Allergies: Patient has no known allergies.   Assessment / Plan:     Visit  Diagnoses: Rheumatoid arthritis involving multiple sites with positive rheumatoid factor (HCC) - 04/22/19: ANA-, sed rate 75, RF 62.5.  Patient has been doing well on hydroxychloroquine .  No synovitis was noted on the examination.  She denies any interruption in the treatment.  High risk medication use - Plaquenil  200 mg 1 tablet by mouth twice daily M-F (started at end of November 2020). PLQ Eye Exam: 01/18/2023 -she reports repeat eye examination in September 2025.  CBC and CMP were normal in March 2025.  Plan: CBC with Differential/Platelet, Comprehensive metabolic panel with GFR.  Information regarding immunization was placed in the AVS.  Primary insomnia-good sleep hygiene discussed.  Vitamin D  deficiency-vitamin D  was normal at 39 March 02, 2023.  She has been taking vitamin D  on a regular basis.  Osteopenia of  multiple sites - DEXA 08/11/2021 LFN T score -1.4, BMD 0.850 no comparison.  Lumbar spine- 0.4, BMD 1.139, no comparison, left forearm T score -0.0 no comparison.October 10, 2023 the bone mineral density as measured at the left femoral neck region of interest is 0.801 g/cm2. T-score: -1.7 .Z-score: -0.2.  Comparison: No change noted per report at [facility].  Use of calcium rich diet and vitamin D  was advised.  Resistive exercises were advised.  She will have repeat DEXA scan in 2 years.  History of squamous cell carcinoma - Biopsy-proven -she is followed by Dr. Rolan Luria appointment was October 2023.  Dyslipidemia-06/12/23 lipid panel cholesterol 252, triglycerides 77, HDL 98, VLDL 13, LDL 141 at [facility].  Orders: Orders Placed This Encounter  Procedures   CBC with Differential/Platelet   Comprehensive metabolic panel with GFR   No orders of the defined types were placed in this encounter.   Follow-Up Instructions: Return in about 5 months (around 06/18/2024) for Rheumatoid arthritis.   Maya Nash, MD  Note - This record has been created using Animal nutritionist.   Chart creation errors have been sought, but may not always  have been located. Such creation errors do not reflect on  the standard of medical care.

## 2024-01-17 ENCOUNTER — Encounter: Payer: Self-pay | Admitting: Rheumatology

## 2024-01-17 ENCOUNTER — Ambulatory Visit: Attending: Rheumatology | Admitting: Rheumatology

## 2024-01-17 VITALS — BP 134/81 | HR 79 | Resp 14 | Ht 61.0 in | Wt 113.2 lb

## 2024-01-17 DIAGNOSIS — M0579 Rheumatoid arthritis with rheumatoid factor of multiple sites without organ or systems involvement: Secondary | ICD-10-CM

## 2024-01-17 DIAGNOSIS — Z79899 Other long term (current) drug therapy: Secondary | ICD-10-CM | POA: Diagnosis not present

## 2024-01-17 DIAGNOSIS — E785 Hyperlipidemia, unspecified: Secondary | ICD-10-CM

## 2024-01-17 DIAGNOSIS — E559 Vitamin D deficiency, unspecified: Secondary | ICD-10-CM

## 2024-01-17 DIAGNOSIS — M8589 Other specified disorders of bone density and structure, multiple sites: Secondary | ICD-10-CM

## 2024-01-17 DIAGNOSIS — F5101 Primary insomnia: Secondary | ICD-10-CM | POA: Diagnosis not present

## 2024-01-17 DIAGNOSIS — Z8589 Personal history of malignant neoplasm of other organs and systems: Secondary | ICD-10-CM

## 2024-01-17 LAB — CBC WITH DIFFERENTIAL/PLATELET
Absolute Lymphocytes: 1043 {cells}/uL (ref 850–3900)
Absolute Monocytes: 395 {cells}/uL (ref 200–950)
Basophils Absolute: 19 {cells}/uL (ref 0–200)
Basophils Relative: 0.4 %
Eosinophils Absolute: 80 {cells}/uL (ref 15–500)
Eosinophils Relative: 1.7 %
HCT: 43.3 % (ref 35.0–45.0)
Hemoglobin: 14.1 g/dL (ref 11.7–15.5)
MCH: 31.2 pg (ref 27.0–33.0)
MCHC: 32.6 g/dL (ref 32.0–36.0)
MCV: 95.8 fL (ref 80.0–100.0)
MPV: 10.2 fL (ref 7.5–12.5)
Monocytes Relative: 8.4 %
Neutro Abs: 3163 {cells}/uL (ref 1500–7800)
Neutrophils Relative %: 67.3 %
Platelets: 229 Thousand/uL (ref 140–400)
RBC: 4.52 Million/uL (ref 3.80–5.10)
RDW: 12 % (ref 11.0–15.0)
Total Lymphocyte: 22.2 %
WBC: 4.7 Thousand/uL (ref 3.8–10.8)

## 2024-01-17 LAB — COMPREHENSIVE METABOLIC PANEL WITH GFR
AG Ratio: 2.2 (calc) (ref 1.0–2.5)
ALT: 16 U/L (ref 6–29)
AST: 20 U/L (ref 10–35)
Albumin: 4.9 g/dL (ref 3.6–5.1)
Alkaline phosphatase (APISO): 59 U/L (ref 37–153)
BUN: 19 mg/dL (ref 7–25)
CO2: 30 mmol/L (ref 20–32)
Calcium: 9.7 mg/dL (ref 8.6–10.4)
Chloride: 100 mmol/L (ref 98–110)
Creat: 0.59 mg/dL (ref 0.50–1.05)
Globulin: 2.2 g/dL (ref 1.9–3.7)
Glucose, Bld: 82 mg/dL (ref 65–99)
Potassium: 4.2 mmol/L (ref 3.5–5.3)
Sodium: 140 mmol/L (ref 135–146)
Total Bilirubin: 0.5 mg/dL (ref 0.2–1.2)
Total Protein: 7.1 g/dL (ref 6.1–8.1)
eGFR: 102 mL/min/1.73m2 (ref >=60)

## 2024-01-17 NOTE — Patient Instructions (Signed)
 Vaccines You are taking a medication(s) that can suppress your immune system.  The following immunizations are recommended: Flu annually Covid-19  Td/Tdap (tetanus, diphtheria, pertussis) every 10 years Pneumonia (Prevnar 15 then Pneumovax 23 at least 1 year apart.  Alternatively, can take Prevnar 20 without needing additional dose) Shingrix: 2 doses from 4 weeks to 6 months apart  Please check with your PCP to make sure you are up to date.

## 2024-01-18 ENCOUNTER — Ambulatory Visit: Payer: Self-pay | Admitting: Rheumatology

## 2024-01-18 NOTE — Progress Notes (Signed)
 CBC and CMP normal

## 2024-03-10 ENCOUNTER — Other Ambulatory Visit: Payer: Self-pay | Admitting: Rheumatology

## 2024-03-10 DIAGNOSIS — M0579 Rheumatoid arthritis with rheumatoid factor of multiple sites without organ or systems involvement: Secondary | ICD-10-CM

## 2024-03-11 NOTE — Telephone Encounter (Signed)
 Last Fill: 11/30/2023  Eye exam: 02/15/2024   Labs: 01/17/2024 CBC and CMP normal.   Next Visit: 07/11/2024  Last Visit: 01/17/2024  IK:Myzlfjunpi arthritis involving multiple sites with positive rheumatoid factor   Current Dose per office note on 01/17/2024: Plaquenil  200 mg 1 tablet by mouth twice daily M-F   Okay to refill Plaquenil ?

## 2024-07-01 NOTE — Progress Notes (Addendum)
 "  Office Visit Note  Patient: Carmen Christensen             Date of Birth: 1960/09/13           MRN: [id number]             PCP: Pura Lenis, MD Referring: Pura Lenis, MD Visit Date: 07/10/2024 Occupation: Data Unavailable  Subjective:  Medication management  History of Present Illness: SCARLETTROSE Christensen is a 64 y.o. female with seropositive rheumatoid arthritis.  She returns today after her last visit in August 2025.  She continues to have some joint pain and discomfort.  She states she has had 3 flares since the last visit.  She states in December she had some discomfort in her right hip joint which lasted for couple of days and then resolved.  A few days later she had discomfort in her right shoulder which also lasted for few days and resolved.  She states on January 14 she developed pain and swelling in her right wrist joint it resolved in three days.  She states her symptoms flared again after doing vacuum.  She has been on hydroxychloroquine  200 mg p.o. twice daily Monday to Friday without any interruption.  She has been under a lot of stress since December.  Patient states that she used Efudex vitamin D  compound on her bilateral forearms for keratosis.  She states she has residual rash and redness from the topical agents.    Activities of Daily Living:  Patient reports morning stiffness for a few minutes.   Patient Reports nocturnal pain.  Difficulty dressing/grooming: Denies Difficulty climbing stairs: Denies Difficulty getting out of chair: Denies Difficulty using hands for taps, buttons, cutlery, and/or writing: Reports  Review of Systems  Constitutional:  Positive for fatigue.  HENT:  Negative for mouth sores and mouth dryness.   Eyes:  Positive for dryness.  Respiratory:  Negative for shortness of breath.   Cardiovascular:  Negative for chest pain and palpitations.  Gastrointestinal:  Negative for blood in stool, constipation and diarrhea.  Endocrine: Negative for  increased urination.  Genitourinary:  Negative for involuntary urination.  Musculoskeletal:  Positive for joint pain, gait problem, joint pain, joint swelling and morning stiffness. Negative for myalgias, muscle weakness, muscle tenderness and myalgias.  Skin:  Positive for rash and sensitivity to sunlight. Negative for color change and hair loss.  Allergic/Immunologic: Negative for susceptible to infections.  Neurological:  Negative for dizziness and headaches.  Hematological:  Negative for swollen glands.  Psychiatric/Behavioral:  Negative for depressed mood and sleep disturbance. The patient is not nervous/anxious.     PMFS History:  Patient Active Problem List   Diagnosis Date Noted   Rheumatoid arthritis involving multiple sites with positive rheumatoid factor (HCC) 02/26/2020   High risk medication use 02/26/2020   Primary insomnia 02/26/2020   Plantar fasciitis, left 02/26/2020   Osteopenia of multiple sites 02/26/2020    Past Medical History:  Diagnosis Date   Rheumatoid arthritis (HCC)     Family History  Problem Relation Age of Onset   Cataracts Father    Heart Problems Father    Colon cancer Paternal Grandfather        not sure age of onset   Healthy Son    Healthy Son    Past Surgical History:  Procedure Laterality Date   DILATION AND CURETTAGE OF UTERUS  06/13/1998   NASAL SINUS SURGERY  06/14/1991   SQUAMOUS CELL CARCINOMA EXCISION Right 03/2022   leg  Social History[1] Social History   Social History Narrative   Not on file     Immunization History  Administered Date(s) Administered   PFIZER(Purple Top)SARS-COV-2 Vaccination 08/10/2019, 08/31/2019, 04/10/2020   Pfizer Covid-19 Vaccine Bivalent Booster 33yrs & up 06/03/2021   Pfizer(Comirnaty)Fall Seasonal Vaccine 12 years and older 06/10/2022, 06/12/2023     Objective: Vital Signs: BP 136/84   Pulse 72   Temp 98 F (36.7 C)   Resp 13   Ht 5' 1 (1.549 m)   Wt 118 lb 3.2 oz (53.6 kg)   LMP  02/26/2014   BMI 22.33 kg/m    Physical Exam Vitals and nursing note reviewed.  Constitutional:      Appearance: She is well-developed.  HENT:     Head: Normocephalic and atraumatic.  Eyes:     Conjunctiva/sclera: Conjunctivae normal.  Cardiovascular:     Rate and Rhythm: Normal rate and regular rhythm.     Heart sounds: Normal heart sounds.  Pulmonary:     Effort: Pulmonary effort is normal.     Breath sounds: Normal breath sounds.  Abdominal:     General: Bowel sounds are normal.     Palpations: Abdomen is soft.  Musculoskeletal:     Cervical back: Normal range of motion.  Skin:    General: Skin is warm and dry.     Capillary Refill: Capillary refill takes less than 2 seconds.     Comments: Erythema noted on dorsal aspect both forearms  Neurological:     Mental Status: She is alert and oriented to person, place, and time.  Psychiatric:        Behavior: Behavior normal.      Musculoskeletal Exam: Cervical, thoracic and lumbar spine were in good range of motion.  There was no SI joint tenderness.  Shoulder joints, elbow joints, wrist joints, MCPs, PIPs and DIPs were in good range of motion with no synovitis.  Hip joints and knee joints were in good range of motion without any warmth swelling or effusion.  There was no tenderness over ankles or MTPs.   CDAI Exam: CDAI Score: -- Patient Global: 5 / 100; Provider Global: 5 / 100 Swollen: --; Tender: -- Joint Exam 07/10/2024   No joint exam has been documented for this visit   There is currently no information documented on the homunculus. Go to the Rheumatology activity and complete the homunculus joint exam.  Investigation: No additional findings.  Imaging: No results found.  Recent Labs: Lab Results  Component Value Date   WBC 4.7 01/17/2024   HGB 14.1 01/17/2024   PLT 229 01/17/2024   NA 140 01/17/2024   K 4.2 01/17/2024   CL 100 01/17/2024   CO2 30 01/17/2024   GLUCOSE 82 01/17/2024   BUN 19 01/17/2024    CREATININE 0.59 01/17/2024   BILITOT 0.5 01/17/2024   AST 20 01/17/2024   ALT 16 01/17/2024   PROT 7.1 01/17/2024   CALCIUM 9.7 01/17/2024   GFRAA 114 07/29/2020   QFTBGOLDPLUS NEGATIVE 05/02/2019   June 11, 2024 WBC 4.5, hemoglobin 12.6, platelets 243 CMP creatinine 0.59 AST 26, ALT 25, vitamin D  34.9  Speciality Comments: PLQ Eye Exam: 02/15/2024 Normal @ [facility] follow up in 1 year.  PLQ started in November 2020  Procedures:  No procedures performed Allergies: Patient has no known allergies.   Assessment / Plan:     Visit Diagnoses: Rheumatoid arthritis involving multiple sites with positive rheumatoid factor (HCC) - 04/22/19: ANA-, sed rate  75, RF 62.5.  Patient states that she has had few episodes of arthralgias involving her right hip, right shoulder and right wrist in the last couple of months.  The last episode was in July 14 with swelling in her right wrist.  She states her symptoms resolved after 3 days and then recurred after doing vacuuming.  The symptoms resolved again.  Currently she does not have any joint pain or discomfort.  She denies any interruption in Plaquenil  therapy.  She has been taking Plaquenil  on a regular basis. I discussed the option of switching to methotrexate if her symptoms persist.  Patient will notify us  if she continues to have joint pain and discomfort..  High risk medication use - Plaquenil  200 mg 1 tablet by mouth twice daily M-F (started at end of November 2020). PLQ Eye Exam: 02/15/2024.  Labs were done at [facility]: June 11, 2024 WBC 4.5, hemoglobin 12.6, platelets 243 CMP creatinine 0.59 AST 26, ALT 25, vitamin D  34.9.  Information reimmunization was provided.  Pain in both hands - She has been having increased discomfort in her wrist joint intermittently.  No synovitis was noted on the examination.Plan: XR Hand 2 View Right, XR Hand 2 View Left. X-rays were reviewed with the patient.  No radiographic progression was  noted.Early degenerative changes were noted.  Pain in both feet -She has not had much discomfort in her feet. Plan: XR Foot 2 Views Right, XR Foot 2 Views Left.Early degenerative changes were noted on the x-rays today.  Primary insomnia  Vitamin D  deficiency  Osteopenia of multiple sites - October 10, 2023 the bone mineral density as measured at the left femoral neck region of interest is 0.801 g/cm2. T-score: -1.7 .Z-score: -0.2.  History of squamous cell carcinoma - Biopsy-proven -she is followed by Dr. Rolan Luria appointment was October 2023.    Orders: Orders Placed This Encounter  Procedures   XR Hand 2 View Right   XR Hand 2 View Left   XR Foot 2 Views Right   XR Foot 2 Views Left   No orders of the defined types were placed in this encounter.    Follow-Up Instructions: Return in about 5 months (around 12/08/2024) for Rheumatoid arthritis.   Maya Nash, MD  Note - This record has been created using Animal nutritionist.  Chart creation errors have been sought, but may not always  have been located. Such creation errors do not reflect on  the standard of medical care.     [1]  Social History Tobacco Use   Smoking status: Never    Passive exposure: Never   Smokeless tobacco: Never  Vaping Use   Vaping status: Never Used  Substance Use Topics   Alcohol use: Yes    Comment: occ   Drug use: No   "

## 2024-07-05 ENCOUNTER — Other Ambulatory Visit: Payer: Self-pay | Admitting: Rheumatology

## 2024-07-05 DIAGNOSIS — M0579 Rheumatoid arthritis with rheumatoid factor of multiple sites without organ or systems involvement: Secondary | ICD-10-CM

## 2024-07-10 ENCOUNTER — Ambulatory Visit

## 2024-07-10 ENCOUNTER — Ambulatory Visit: Attending: Rheumatology | Admitting: Rheumatology

## 2024-07-10 ENCOUNTER — Encounter: Payer: Self-pay | Admitting: Rheumatology

## 2024-07-10 VITALS — BP 136/84 | HR 72 | Temp 98.0°F | Resp 13 | Ht 61.0 in | Wt 118.2 lb

## 2024-07-10 DIAGNOSIS — M79671 Pain in right foot: Secondary | ICD-10-CM

## 2024-07-10 DIAGNOSIS — M79641 Pain in right hand: Secondary | ICD-10-CM

## 2024-07-10 DIAGNOSIS — F5101 Primary insomnia: Secondary | ICD-10-CM

## 2024-07-10 DIAGNOSIS — Z8589 Personal history of malignant neoplasm of other organs and systems: Secondary | ICD-10-CM | POA: Diagnosis not present

## 2024-07-10 DIAGNOSIS — Z79899 Other long term (current) drug therapy: Secondary | ICD-10-CM | POA: Diagnosis not present

## 2024-07-10 DIAGNOSIS — E559 Vitamin D deficiency, unspecified: Secondary | ICD-10-CM

## 2024-07-10 DIAGNOSIS — M0579 Rheumatoid arthritis with rheumatoid factor of multiple sites without organ or systems involvement: Secondary | ICD-10-CM

## 2024-07-10 DIAGNOSIS — M79672 Pain in left foot: Secondary | ICD-10-CM

## 2024-07-10 DIAGNOSIS — E785 Hyperlipidemia, unspecified: Secondary | ICD-10-CM | POA: Diagnosis not present

## 2024-07-10 DIAGNOSIS — M8589 Other specified disorders of bone density and structure, multiple sites: Secondary | ICD-10-CM | POA: Diagnosis not present

## 2024-07-10 DIAGNOSIS — M79642 Pain in left hand: Secondary | ICD-10-CM

## 2024-07-10 NOTE — Patient Instructions (Addendum)
 Vaccines You are taking a medication(s) that can suppress your immune system.  The following immunizations are recommended: Flu annually Covid-19  Td/Tdap (tetanus, diphtheria, pertussis) every 10 years Pneumonia (Prevnar 15 then Pneumovax 23 at least 1 year apart.  Alternatively, can take Prevnar 20 without needing additional dose) Shingrix: 2 doses from 4 weeks to 6 months apart  Please check with your PCP to make sure you are up to date.  Annual eye examination was advised.SABRA

## 2024-07-11 ENCOUNTER — Ambulatory Visit: Admitting: Rheumatology

## 2024-07-14 ENCOUNTER — Encounter: Payer: Self-pay | Admitting: Rheumatology

## 2024-07-15 NOTE — Telephone Encounter (Signed)
 I returned patient's call and per her removed dyslipidemia from her problem list which was carried over from previous records.  I also explained to her that she has mild degenerative changes on the x-rays but she did not have any clinical findings of osteoarthritis.  I discussed that blood pressure reading was taken in the office and recorded it does not mean that one elevated blood pressure reading means hypertension or heart disease.  There is no mention of any abnormality in her heart.  X-ray findings were also reviewed with the patient.  She voiced understanding.

## 2024-12-10 ENCOUNTER — Ambulatory Visit: Admitting: Rheumatology
